# Patient Record
Sex: Male | Born: 1960
Health system: Southern US, Community
[De-identification: ages and names within clinical notes are randomized; demographics above are authoritative.]

## PROBLEM LIST (undated history)

## (undated) DIAGNOSIS — T7840XA Allergy, unspecified, initial encounter: Secondary | ICD-10-CM

## (undated) DIAGNOSIS — C8290 Follicular lymphoma, unspecified, unspecified site: Secondary | ICD-10-CM

## (undated) DIAGNOSIS — I10 Essential (primary) hypertension: Secondary | ICD-10-CM

## (undated) HISTORY — PX: TONSILLECTOMY AND ADENOIDECTOMY: SHX28

## (undated) HISTORY — DX: Allergy, unspecified, initial encounter: T78.40XA

---

## 2017-03-17 ENCOUNTER — Other Ambulatory Visit: Payer: Self-pay | Admitting: Oncology

## 2017-03-17 DIAGNOSIS — C8290 Follicular lymphoma, unspecified, unspecified site: Secondary | ICD-10-CM

## 2017-03-17 HISTORY — DX: Follicular lymphoma, unspecified, unspecified site: C82.90

## 2017-04-06 ENCOUNTER — Other Ambulatory Visit: Payer: Self-pay | Admitting: Oncology

## 2017-04-16 NOTE — Progress Notes (Signed)
[facility]  Telephone:([phone] [phone]  ID: Jonathon Jacobs OB: July 01, 1961  MR#: [id number]  UVO#:[id number]  Patient Care Team: Patient, No Pcp Per as PCP - General (General Practice)  CHIEF COMPLAINT: Follicular lymphoma, left superior forehead  INTERVAL HISTORY: Patient is a 56 year old male with no significant past medical history who noticed a slowly increasing mass on his left forehead over the past year. Subsequent biopsy and excision revealed a probable follicular lymphoma. He is anxious, but otherwise feels well. He has no neurologic complaints. He denies any fevers, chills, night sweats, or weight loss. He has no chest pain or shortness of breath. He denies any nausea, vomiting, constipation, or diarrhea. He has no urinary complaints. Patient otherwise feels well and offers no further specific complaints.   REVIEW OF SYSTEMS:   Review of Systems  Constitutional: Negative.  Negative for chills, diaphoresis, fever, malaise/fatigue and weight loss.  Respiratory: Negative.  Negative for cough and shortness of breath.   Cardiovascular: Negative.  Negative for chest pain and leg swelling.  Gastrointestinal: Negative.  Negative for abdominal pain.  Genitourinary: Negative.   Musculoskeletal: Negative.   Skin: Negative.  Negative for rash.  Neurological: Negative for sensory change and weakness.  Psychiatric/Behavioral: The patient is nervous/anxious.     As per HPI. Otherwise, a complete review of systems is negative.  PAST MEDICAL HISTORY: Past Medical History:  Diagnosis Date  . Allergy     PAST SURGICAL HISTORY: History reviewed. No pertinent surgical history.  FAMILY HISTORY: Family History  Problem Relation Age of Onset  . Cervical cancer Mother   . Heart disease Mother   . Prostate cancer Father   . Dementia Maternal Grandfather     ADVANCED DIRECTIVES (Y/N):  N  HEALTH MAINTENANCE: Social History  Substance Use Topics  .  Smoking status: Former Research scientist (life sciences)  . Smokeless tobacco: Never Used  . Alcohol use No     Colonoscopy:  PAP:  Bone density:  Lipid panel:  No Known Allergies  Current Outpatient Prescriptions  Medication Sig Dispense Refill  . Ascorbic Acid (VITAMIN C PO) Take by mouth.    Marland Kitchen b complex vitamins capsule Take 1 capsule by mouth daily.    . Grape Seed Extract 100 MG CAPS Take by mouth.    Marland Kitchen MILK THISTLE PO Take by mouth.    Marland Kitchen UNABLE TO FIND Med Name: silca     No current facility-administered medications for this visit.     OBJECTIVE: Vitals:   04/20/17 1208  BP: (!) 173/91  Pulse: (!) 53  Resp: 18  Temp: 98.2 F (36.8 C)     Body mass index is 27.29 kg/m.    ECOG FS:0 - Asymptomatic  General: Well-developed, well-nourished, no acute distress. Eyes: Pink conjunctiva, anicteric sclera. HEENT: Normocephalic, moist mucous membranes, clear oropharnyx. Well-healed surgical scar left forehead. Lungs: Clear to auscultation bilaterally. Heart: Regular rate and rhythm. No rubs, murmurs, or gallops. Abdomen: Soft, nontender, nondistended. No organomegaly noted, normoactive bowel sounds. Musculoskeletal: No edema, cyanosis, or clubbing. Neuro: Alert, answering all questions appropriately. Cranial nerves grossly intact. Skin: No rashes or petechiae noted. Psych: Normal affect. Lymphatics: No cervical, calvicular, axillary or inguinal LAD.   LAB RESULTS:  Lab Results  Component Value Date   NA 139 04/20/2017   K 4.1 04/20/2017   CL 104 04/20/2017   CO2 29 04/20/2017   GLUCOSE 98 04/20/2017   BUN 14 04/20/2017   CREATININE 1.15 04/20/2017   CALCIUM 9.1 04/20/2017  PROT 7.3 04/20/2017   ALBUMIN 4.3 04/20/2017   AST 25 04/20/2017   ALT 29 04/20/2017   ALKPHOS 66 04/20/2017   BILITOT 2.1 (H) 04/20/2017   GFRNONAA >60 04/20/2017   GFRAA >60 04/20/2017    Lab Results  Component Value Date   WBC 5.3 04/20/2017   NEUTROABS 2.8 04/20/2017   HGB 15.0 04/20/2017   HCT 42.5  04/20/2017   MCV 87.9 04/20/2017   PLT 209 04/20/2017     STUDIES: No results found.  ASSESSMENT: Follicular lymphoma, left superior forehead  PLAN:    1. Follicular lymphoma, left superior forehead: Pathology reviewed independently revealing probable follicular center lymphoma with B-cell gene rearrangement pending. Both peripheral and deep margins were positive. Plan on obtaining slides for further evaluation. We will get a PET scan in the next week to assess if there is any systemic disease or this is a confined cutaneous follicular lymphoma. If PET scan is positive, patient will also require a bone marrow biopsy. If lymphoma is isolated to his left superior forehead, he may benefit from XRT for local control of disease. If PET scan reveals more systemic disease, will further evaluate and determine whether observation or treatment is necessary. Return to clinic in 1 week to discuss his PET scan results and treatment planning if necessary.  Approximate 60 minutes was spent in discussion of which greater than 50% was consultation.  Patient expressed understanding and was in agreement with this plan. He also understands that He can call clinic at any time with any questions, concerns, or complaints.   Cancer Staging No matching staging information was found for the patient.  Lloyd Huger, MD   04/20/2017 1:37 PM

## 2017-04-20 ENCOUNTER — Inpatient Hospital Stay: Payer: BLUE CROSS/BLUE SHIELD | Attending: Oncology | Admitting: Oncology

## 2017-04-20 ENCOUNTER — Encounter (INDEPENDENT_AMBULATORY_CARE_PROVIDER_SITE_OTHER): Payer: Self-pay

## 2017-04-20 ENCOUNTER — Encounter: Payer: Self-pay | Admitting: Oncology

## 2017-04-20 ENCOUNTER — Inpatient Hospital Stay: Payer: BLUE CROSS/BLUE SHIELD

## 2017-04-20 VITALS — BP 173/91 | HR 53 | Temp 98.2°F | Resp 18 | Ht 72.0 in | Wt 201.2 lb

## 2017-04-20 DIAGNOSIS — Z79899 Other long term (current) drug therapy: Secondary | ICD-10-CM | POA: Insufficient documentation

## 2017-04-20 DIAGNOSIS — Z87891 Personal history of nicotine dependence: Secondary | ICD-10-CM | POA: Diagnosis not present

## 2017-04-20 DIAGNOSIS — C8291 Follicular lymphoma, unspecified, lymph nodes of head, face, and neck: Secondary | ICD-10-CM | POA: Diagnosis not present

## 2017-04-20 DIAGNOSIS — Z8049 Family history of malignant neoplasm of other genital organs: Secondary | ICD-10-CM | POA: Insufficient documentation

## 2017-04-20 DIAGNOSIS — F419 Anxiety disorder, unspecified: Secondary | ICD-10-CM | POA: Diagnosis not present

## 2017-04-20 DIAGNOSIS — Z8042 Family history of malignant neoplasm of prostate: Secondary | ICD-10-CM | POA: Insufficient documentation

## 2017-04-20 DIAGNOSIS — C8290 Follicular lymphoma, unspecified, unspecified site: Secondary | ICD-10-CM

## 2017-04-20 LAB — CBC WITH DIFFERENTIAL/PLATELET
BASOS PCT: 1 %
Basophils Absolute: 0 10*3/uL (ref 0–0.1)
Eosinophils Absolute: 0.1 10*3/uL (ref 0–0.7)
Eosinophils Relative: 2 %
HCT: 42.5 % (ref 40.0–52.0)
HEMOGLOBIN: 15 g/dL (ref 13.0–18.0)
Lymphocytes Relative: 37 %
Lymphs Abs: 1.9 10*3/uL (ref 1.0–3.6)
MCH: 31 pg (ref 26.0–34.0)
MCHC: 35.3 g/dL (ref 32.0–36.0)
MCV: 87.9 fL (ref 80.0–100.0)
MONOS PCT: 7 %
Monocytes Absolute: 0.4 10*3/uL (ref 0.2–1.0)
NEUTROS PCT: 53 %
Neutro Abs: 2.8 10*3/uL (ref 1.4–6.5)
PLATELETS: 209 10*3/uL (ref 150–440)
RBC: 4.83 MIL/uL (ref 4.40–5.90)
RDW: 12.8 % (ref 11.5–14.5)
WBC: 5.3 10*3/uL (ref 3.8–10.6)

## 2017-04-20 LAB — COMPREHENSIVE METABOLIC PANEL
ALK PHOS: 66 U/L (ref 38–126)
ALT: 29 U/L (ref 17–63)
ANION GAP: 6 (ref 5–15)
AST: 25 U/L (ref 15–41)
Albumin: 4.3 g/dL (ref 3.5–5.0)
BUN: 14 mg/dL (ref 6–20)
CALCIUM: 9.1 mg/dL (ref 8.9–10.3)
CHLORIDE: 104 mmol/L (ref 101–111)
CO2: 29 mmol/L (ref 22–32)
Creatinine, Ser: 1.15 mg/dL (ref 0.61–1.24)
GFR calc non Af Amer: >60 mL/min (ref >60)
GLUCOSE: 98 mg/dL (ref 65–99)
POTASSIUM: 4.1 mmol/L (ref 3.5–5.1)
SODIUM: 139 mmol/L (ref 135–145)
Total Bilirubin: 2.1 mg/dL — ABNORMAL HIGH (ref 0.3–1.2)
Total Protein: 7.3 g/dL (ref 6.5–8.1)

## 2017-04-20 LAB — LACTATE DEHYDROGENASE: LDH: 130 U/L (ref 98–192)

## 2017-04-20 NOTE — Progress Notes (Signed)
Patient is here for follow up  

## 2017-04-27 ENCOUNTER — Inpatient Hospital Stay: Payer: BLUE CROSS/BLUE SHIELD | Admitting: Oncology

## 2017-04-28 ENCOUNTER — Encounter
Admission: RE | Admit: 2017-04-28 | Discharge: 2017-04-28 | Disposition: A | Discharge status: Home / Self Care | Payer: BLUE CROSS/BLUE SHIELD | Source: Ambulatory Visit | Attending: Oncology | Admitting: Oncology

## 2017-04-28 ENCOUNTER — Other Ambulatory Visit: Payer: Self-pay | Admitting: *Deleted

## 2017-04-28 DIAGNOSIS — C8290 Follicular lymphoma, unspecified, unspecified site: Secondary | ICD-10-CM | POA: Insufficient documentation

## 2017-04-28 LAB — GLUCOSE, CAPILLARY: GLUCOSE-CAPILLARY: 89 mg/dL (ref 65–99)

## 2017-04-28 MED ORDER — FLUDEOXYGLUCOSE F - 18 (FDG) INJECTION
12.1000 | Freq: Once | INTRAVENOUS | Status: AC | PRN
  Administered 2017-04-28: 12.1 via INTRAVENOUS

## 2017-04-28 NOTE — Progress Notes (Signed)
[facility]  [phone] [phone]  ID: Jonathon Jacobs OB: 08/22/1960  MR#: [id number]  CSN#:[id number]  Patient Care Team: Patient, No Pcp Per as PCP - General (General Practice)  CHIEF COMPLAINT: Follicular lymphoma, left superior forehead  INTERVAL HISTORY: Patient returns to clinic today for further evaluation and discussion of his PET scan results. He continues to be anxious, but otherwise feels well. He has no neurologic complaints. He denies any fevers, chills, night sweats, or weight loss. He has no chest pain or shortness of breath. He denies any nausea, vomiting, constipation, or diarrhea. He has no urinary complaints. Patient otherwise feels well and offers no further specific complaints.   REVIEW OF SYSTEMS:   Review of Systems  Constitutional: Negative.  Negative for chills, diaphoresis, fever, malaise/fatigue and weight loss.  Respiratory: Negative.  Negative for cough and shortness of breath.   Cardiovascular: Negative.  Negative for chest pain and leg swelling.  Gastrointestinal: Negative.  Negative for abdominal pain.  Genitourinary: Negative.   Musculoskeletal: Negative.   Skin: Negative.  Negative for rash.  Neurological: Negative for sensory change and weakness.  Psychiatric/Behavioral: The patient is nervous/anxious.     As per HPI. Otherwise, a complete review of systems is negative.  PAST MEDICAL HISTORY: Past Medical History:  Diagnosis Date  . Allergy     PAST SURGICAL HISTORY: No past surgical history on file.  FAMILY HISTORY: Family History  Problem Relation Age of Onset  . Cervical cancer Mother   . Heart disease Mother   . Prostate cancer Father   . Dementia Maternal Grandfather     ADVANCED DIRECTIVES (Y/N):  N  HEALTH MAINTENANCE: Social History  Substance Use Topics  . Smoking status: Former Smoker  . Smokeless tobacco: Never Used  . Alcohol use No     Colonoscopy:  PAP:  Bone  density:  Lipid panel:  No Known Allergies  Current Outpatient Prescriptions  Medication Sig Dispense Refill  . Ascorbic Acid (VITAMIN C PO) Take by mouth.    . b complex vitamins capsule Take 1 capsule by mouth daily.    . Grape Seed Extract 100 MG CAPS Take by mouth.    . MILK THISTLE PO Take by mouth.    . UNABLE TO FIND Med Name: silca     No current facility-administered medications for this visit.     OBJECTIVE: Vitals:   04/29/17 1444  BP: (!) 168/95  Pulse: (!) 58  Resp: 18  Temp: 97.9 F (36.6 C)     Body mass index is 27.61 kg/m.    ECOG FS:0 - Asymptomatic  General: Well-developed, well-nourished, no acute distress. Eyes: Pink conjunctiva, anicteric sclera. HEENT: Normocephalic, moist mucous membranes, clear oropharnyx. Well-healed surgical scar left forehead. Lungs: Clear to auscultation bilaterally. Heart: Regular rate and rhythm. No rubs, murmurs, or gallops. Abdomen: Soft, nontender, nondistended. No organomegaly noted, normoactive bowel sounds. Musculoskeletal: No edema, cyanosis, or clubbing. Neuro: Alert, answering all questions appropriately. Cranial nerves grossly intact. Skin: No rashes or petechiae noted. Psych: Normal affect. Lymphatics: No cervical, calvicular, axillary or inguinal LAD.   LAB RESULTS:  Lab Results  Component Value Date   NA 139 04/20/2017   K 4.1 04/20/2017   CL 104 04/20/2017   CO2 29 04/20/2017   GLUCOSE 98 04/20/2017   BUN 14 04/20/2017   CREATININE 1.15 04/20/2017   CALCIUM 9.1 04/20/2017   PROT 7.3 04/20/2017   ALBUMIN 4.3 04/20/2017   AST 25 04/20/2017   ALT   29 04/20/2017   ALKPHOS 66 04/20/2017   BILITOT 2.1 (H) 04/20/2017   GFRNONAA >60 04/20/2017   GFRAA >60 04/20/2017    Lab Results  Component Value Date   WBC 5.3 04/20/2017   NEUTROABS 2.8 04/20/2017   HGB 15.0 04/20/2017   HCT 42.5 04/20/2017   MCV 87.9 04/20/2017   PLT 209 04/20/2017     STUDIES: Nm Pet Image Initial (pi) Skull Base To  Thigh  Result Date: 04/28/2017 CLINICAL DATA:  Initial Treatment strategy for follicular lymphoma. EXAM: NUCLEAR MEDICINE PET SKULL BASE TO THIGH TECHNIQUE: 12.1 mCi F-18 FDG was injected intravenously. Full-ring PET imaging was performed from the skull vertex to thigh after the radiotracer. CT data was obtained and used for attenuation correction and anatomic localization. FASTING BLOOD GLUCOSE:  Value: 89 mg/dl COMPARISON:  None. FINDINGS: NECK/HEAD Very subtle cutaneous activity noted along the right forehead, but with maximum SUV of only about 2.2. No other significant scalp activity identified. No hypermetabolic adenopathy in the neck. Small mucous retention cyst in the left maxillary sinus. CHEST No hypermetabolic mediastinal or hilar nodes. No suspicious pulmonary nodules on the CT data. ABDOMEN/PELVIS No abnormal hypermetabolic activity within the liver, pancreas, adrenal glands, or spleen. No hypermetabolic lymph nodes in the abdomen or pelvis. No splenomegaly. SKELETON No focal hypermetabolic activity to suggest skeletal metastasis. Bridging spurring of the left sacroiliac joint with minimal spurring of the right sacroiliac joint. IMPRESSION: 1. No findings of active lymphoma in the neck, chest, abdomen/pelvis, or visualized skeleton. There is some very subtle cutaneous activity asymmetry along the right forehead, maximum SUV 2.2, potentially correlating with the biopsied region. Electronically Signed   By: Van Clines M.D.   On: 04/28/2017 16:14    ASSESSMENT: Follicular lymphoma, left superior forehead. Stage IE.  PLAN:    1. Follicular lymphoma, left superior forehead: Pathology reviewed independently revealing probable follicular center lymphoma with B-cell gene rearrangement pending. Both peripheral and deep margins were positive. Plan on obtaining slides for further evaluation. PET scan results reviewed independently and reported as above with no evidence of systemic disease. Because  of this, patient does not require bone marrow biopsy at this time. A referral was given to radiation oncology for consideration of local control of disease. He does not require systemic therapy at this time. Return to clinic in 3 months for routine evaluation. We will likely repeat imaging in 6 months.  Approximately 30 minutes was spent in discussion of which greater than 50% was consultation.  Patient expressed understanding and was in agreement with this plan. He also understands that He can call clinic at any time with any questions, concerns, or complaints.   Cancer Staging Follicular lymphoma ([place]) Staging form: Hodgkin and Non-Hodgkin Lymphoma, AJCC 8th Edition - Clinical stage from 9/48/0165: Stage IE (Follicular lymphoma) - Signed by Lloyd Huger, MD on 04/30/2017   Lloyd Huger, MD   04/30/2017 3:44 PM

## 2017-04-29 ENCOUNTER — Inpatient Hospital Stay (HOSPITAL_BASED_OUTPATIENT_CLINIC_OR_DEPARTMENT_OTHER): Payer: BLUE CROSS/BLUE SHIELD | Admitting: Oncology

## 2017-04-29 DIAGNOSIS — Z79899 Other long term (current) drug therapy: Secondary | ICD-10-CM

## 2017-04-29 DIAGNOSIS — C8291 Follicular lymphoma, unspecified, lymph nodes of head, face, and neck: Secondary | ICD-10-CM | POA: Diagnosis not present

## 2017-04-29 DIAGNOSIS — Z8042 Family history of malignant neoplasm of prostate: Secondary | ICD-10-CM

## 2017-04-29 DIAGNOSIS — Z87891 Personal history of nicotine dependence: Secondary | ICD-10-CM | POA: Diagnosis not present

## 2017-04-29 DIAGNOSIS — F419 Anxiety disorder, unspecified: Secondary | ICD-10-CM

## 2017-04-29 DIAGNOSIS — C8290 Follicular lymphoma, unspecified, unspecified site: Secondary | ICD-10-CM | POA: Diagnosis not present

## 2017-04-29 DIAGNOSIS — Z8049 Family history of malignant neoplasm of other genital organs: Secondary | ICD-10-CM | POA: Diagnosis not present

## 2017-05-03 ENCOUNTER — Encounter: Payer: Self-pay | Admitting: Radiation Oncology

## 2017-05-03 ENCOUNTER — Ambulatory Visit
Admission: RE | Admit: 2017-05-03 | Discharge: 2017-05-03 | Disposition: A | Discharge status: Home / Self Care | Payer: BLUE CROSS/BLUE SHIELD | Source: Ambulatory Visit | Attending: Radiation Oncology | Admitting: Radiation Oncology

## 2017-05-03 VITALS — BP 152/88 | HR 52 | Temp 97.6°F | Resp 18 | Ht 72.0 in | Wt 202.3 lb

## 2017-05-03 DIAGNOSIS — Z510 Encounter for antineoplastic radiation therapy: Secondary | ICD-10-CM | POA: Insufficient documentation

## 2017-05-03 DIAGNOSIS — C8201 Follicular lymphoma grade I, lymph nodes of head, face, and neck: Secondary | ICD-10-CM | POA: Insufficient documentation

## 2017-05-03 DIAGNOSIS — Z8041 Family history of malignant neoplasm of ovary: Secondary | ICD-10-CM | POA: Insufficient documentation

## 2017-05-03 DIAGNOSIS — Z87891 Personal history of nicotine dependence: Secondary | ICD-10-CM | POA: Insufficient documentation

## 2017-05-03 DIAGNOSIS — Z8042 Family history of malignant neoplasm of prostate: Secondary | ICD-10-CM | POA: Insufficient documentation

## 2017-05-03 DIAGNOSIS — Z79899 Other long term (current) drug therapy: Secondary | ICD-10-CM | POA: Insufficient documentation

## 2017-05-03 DIAGNOSIS — C8290 Follicular lymphoma, unspecified, unspecified site: Secondary | ICD-10-CM

## 2017-05-03 NOTE — Consult Note (Signed)
NEW PATIENT EVALUATION  Name: Jonathon Jacobs  MRN: [id number]  Date:   05/03/2017     DOB: [birth year]   This 56 y.o. male patient presents to the clinic for initial evaluation of stage I E follicular B-cell lymphoma of the right for head.  REFERRING PHYSICIAN: No ref. provider found  CHIEF COMPLAINT:  Chief Complaint  Patient presents with  . Cancer    Pt is here for initial consultation of lymphoma    DIAGNOSIS: The encounter diagnosis was Follicular lymphoma, unspecified grade, unspecified body region [facility]).   PREVIOUS INVESTIGATIONS:  PET CT scan reviewed Pathology reports reviewed Clinical notes reviewed  HPI: Patient is a 56 year old male who presented with a slowly growing mass on his left forehead. He underwent biopsy which was positive for lytic follicular lymphoma B cell type. Patient then underwent a PET CT scan showing no evidence of peripheral adenopathy. There was some minor hypermetabolic activity in the area of the lesion on the right for head. On review of his pathology both peripheral and deep margins were positive. He has been seen by medical oncology based on the one E nature of his lesion has recommended curative radiation therapy. He is seen today for evaluation. He is doing well he specifically denies fever chills night sweats. He also specifically denies any other areas of adenopathy.  PLANNED TREATMENT REGIMEN: Electron beam therapy  PAST MEDICAL HISTORY:  has a past medical history of Allergy.    PAST SURGICAL HISTORY: History reviewed. No pertinent surgical history.  FAMILY HISTORY: family history includes Cervical cancer in his mother; Dementia in his maternal grandfather; Heart disease in his mother; Prostate cancer in his father.  SOCIAL HISTORY:  reports that he has quit smoking. He has never used smokeless tobacco. He reports that he does not drink alcohol or use drugs.  ALLERGIES: Patient has no known allergies.  MEDICATIONS:  Current Outpatient  Prescriptions  Medication Sig Dispense Refill  . Ascorbic Acid (VITAMIN C PO) Take by mouth.    Marland Kitchen b complex vitamins capsule Take 1 capsule by mouth daily.    . Grape Seed Extract 100 MG CAPS Take by mouth.    Marland Kitchen MILK THISTLE PO Take by mouth.    Marland Kitchen UNABLE TO FIND Med Name: silca     No current facility-administered medications for this encounter.     ECOG PERFORMANCE STATUS:  0 - Asymptomatic  REVIEW OF SYSTEMS:  Patient denies any weight loss, fatigue, weakness, fever, chills or night sweats. Patient denies any loss of vision, blurred vision. Patient denies any ringing  of the ears or hearing loss. No irregular heartbeat. Patient denies heart murmur or history of fainting. Patient denies any chest pain or pain radiating to her upper extremities. Patient denies any shortness of breath, difficulty breathing at night, cough or hemoptysis. Patient denies any swelling in the lower legs. Patient denies any nausea vomiting, vomiting of blood, or coffee ground material in the vomitus. Patient denies any stomach pain. Patient states has had normal bowel movements no significant constipation or diarrhea. Patient denies any dysuria, hematuria or significant nocturia. Patient denies any problems walking, swelling in the joints or loss of balance. Patient denies any skin changes, loss of hair or loss of weight. Patient denies any excessive worrying or anxiety or significant depression. Patient denies any problems with insomnia. Patient denies excessive thirst, polyuria, polydipsia. Patient denies any swollen glands, patient denies easy bruising or easy bleeding. Patient denies any recent infections, allergies or URI. Patient "  s visual fields have not changed significantly in recent time.    PHYSICAL EXAM: BP (!) 152/88   Pulse (!) 52   Temp 97.6 F (36.4 C)   Resp 18   Ht 6' (1.829 m)   Wt 202 lb 4.4 oz (91.7 kg)   BMI 27.43 kg/m  He has a small erythematous area of the right scalp for head. No real  nodular component is noted. No evidence of preauricular sub-digastric cervical or supraclavicular adenopathy is identified. He has no other adenopathy. Well-developed well-nourished patient in NAD. HEENT reveals PERLA, EOMI, discs not visualized.  Oral cavity is clear. No oral mucosal lesions are identified. Neck is clear without evidence of cervical or supraclavicular adenopathy. Lungs are clear to A&P. Cardiac examination is essentially unremarkable with regular rate and rhythm without murmur rub or thrill. Abdomen is benign with no organomegaly or masses noted. Motor sensory and DTR levels are equal and symmetric in the upper and lower extremities. Cranial nerves II through XII are grossly intact. Proprioception is intact. No peripheral adenopathy or edema is identified. No motor or sensory levels are noted. Crude visual fields are within normal range.  LABORATORY DATA: Pathology reports are reviewed    RADIOLOGY RESULTS: PET CT scan is reviewed   IMPRESSION: Stage I E follicular B-cell lymphoma of the right for head in 56 year old male  PLAN: At this time I to go ahead with it course of electron beam therapy. I'll incorporate the lesion with a 0.5-1 cm margin. I will plan on delivering 3000 cGy in 15 fractions. Risks and benefits of treatment including hyperpigmentation of the skin skin reaction fatigue slight alteration of blood counts all were discussed in detail. I see no evidence of neck adenopathy on his PET/CT scan so will not treat his peripheral lymphatics including his sub-digastric cervical or supraclavicular nodes. Reviewed risks and benefits were reviewed with the patient and his wife both seem to comprehend my treatment plan well. I first a separate ordered CT simulation in about 2 weeks since they are planning a vacation.  I would like to take this opportunity to thank you for allowing me to participate in the care of your patient.Armstead Peaks., MD

## 2017-05-07 LAB — SLIDE CONSULT, PATHOLOGY ARMC

## 2017-05-10 LAB — PATHOLOGY

## 2017-05-18 LAB — PATHOLOGY

## 2017-05-20 ENCOUNTER — Ambulatory Visit
Admission: RE | Admit: 2017-05-20 | Discharge: 2017-05-20 | Disposition: A | Discharge status: Home / Self Care | Payer: BLUE CROSS/BLUE SHIELD | Source: Ambulatory Visit | Attending: Radiation Oncology | Admitting: Radiation Oncology

## 2017-05-20 DIAGNOSIS — C8201 Follicular lymphoma grade I, lymph nodes of head, face, and neck: Secondary | ICD-10-CM | POA: Diagnosis not present

## 2017-05-25 DIAGNOSIS — C8201 Follicular lymphoma grade I, lymph nodes of head, face, and neck: Secondary | ICD-10-CM | POA: Diagnosis not present

## 2017-06-03 ENCOUNTER — Ambulatory Visit
Admission: RE | Admit: 2017-06-03 | Discharge: 2017-06-03 | Disposition: A | Discharge status: Home / Self Care | Payer: BLUE CROSS/BLUE SHIELD | Source: Ambulatory Visit | Attending: Radiation Oncology | Admitting: Radiation Oncology

## 2017-06-03 DIAGNOSIS — C8201 Follicular lymphoma grade I, lymph nodes of head, face, and neck: Secondary | ICD-10-CM | POA: Diagnosis not present

## 2017-06-04 ENCOUNTER — Ambulatory Visit
Admission: RE | Admit: 2017-06-04 | Discharge: 2017-06-04 | Disposition: A | Discharge status: Home / Self Care | Payer: BLUE CROSS/BLUE SHIELD | Source: Ambulatory Visit | Attending: Radiation Oncology | Admitting: Radiation Oncology

## 2017-06-04 DIAGNOSIS — C8201 Follicular lymphoma grade I, lymph nodes of head, face, and neck: Secondary | ICD-10-CM | POA: Diagnosis not present

## 2017-06-07 ENCOUNTER — Ambulatory Visit
Admission: RE | Admit: 2017-06-07 | Discharge: 2017-06-07 | Disposition: A | Discharge status: Home / Self Care | Payer: BLUE CROSS/BLUE SHIELD | Source: Ambulatory Visit | Attending: Radiation Oncology | Admitting: Radiation Oncology

## 2017-06-07 DIAGNOSIS — C8201 Follicular lymphoma grade I, lymph nodes of head, face, and neck: Secondary | ICD-10-CM | POA: Diagnosis not present

## 2017-06-08 ENCOUNTER — Ambulatory Visit
Admission: RE | Admit: 2017-06-08 | Discharge: 2017-06-08 | Disposition: A | Discharge status: Home / Self Care | Payer: BLUE CROSS/BLUE SHIELD | Source: Ambulatory Visit | Attending: Radiation Oncology | Admitting: Radiation Oncology

## 2017-06-08 DIAGNOSIS — C8201 Follicular lymphoma grade I, lymph nodes of head, face, and neck: Secondary | ICD-10-CM | POA: Diagnosis not present

## 2017-06-09 ENCOUNTER — Ambulatory Visit
Admission: RE | Admit: 2017-06-09 | Discharge: 2017-06-09 | Disposition: A | Discharge status: Home / Self Care | Payer: BLUE CROSS/BLUE SHIELD | Source: Ambulatory Visit | Attending: Radiation Oncology | Admitting: Radiation Oncology

## 2017-06-09 DIAGNOSIS — C8201 Follicular lymphoma grade I, lymph nodes of head, face, and neck: Secondary | ICD-10-CM | POA: Diagnosis not present

## 2017-06-10 ENCOUNTER — Ambulatory Visit
Admission: RE | Admit: 2017-06-10 | Discharge: 2017-06-10 | Disposition: A | Discharge status: Home / Self Care | Payer: BLUE CROSS/BLUE SHIELD | Source: Ambulatory Visit | Attending: Radiation Oncology | Admitting: Radiation Oncology

## 2017-06-10 DIAGNOSIS — C8201 Follicular lymphoma grade I, lymph nodes of head, face, and neck: Secondary | ICD-10-CM | POA: Diagnosis not present

## 2017-06-11 ENCOUNTER — Ambulatory Visit
Admission: RE | Admit: 2017-06-11 | Discharge: 2017-06-11 | Disposition: A | Discharge status: Home / Self Care | Payer: BLUE CROSS/BLUE SHIELD | Source: Ambulatory Visit | Attending: Radiation Oncology | Admitting: Radiation Oncology

## 2017-06-11 DIAGNOSIS — C8201 Follicular lymphoma grade I, lymph nodes of head, face, and neck: Secondary | ICD-10-CM | POA: Diagnosis not present

## 2017-06-14 ENCOUNTER — Ambulatory Visit
Admission: RE | Admit: 2017-06-14 | Discharge: 2017-06-14 | Disposition: A | Discharge status: Home / Self Care | Payer: BLUE CROSS/BLUE SHIELD | Source: Ambulatory Visit | Attending: Radiation Oncology | Admitting: Radiation Oncology

## 2017-06-14 DIAGNOSIS — C8201 Follicular lymphoma grade I, lymph nodes of head, face, and neck: Secondary | ICD-10-CM | POA: Diagnosis not present

## 2017-06-15 ENCOUNTER — Ambulatory Visit
Admission: RE | Admit: 2017-06-15 | Discharge: 2017-06-15 | Disposition: A | Discharge status: Home / Self Care | Payer: BLUE CROSS/BLUE SHIELD | Source: Ambulatory Visit | Attending: Radiation Oncology | Admitting: Radiation Oncology

## 2017-06-15 DIAGNOSIS — C8201 Follicular lymphoma grade I, lymph nodes of head, face, and neck: Secondary | ICD-10-CM | POA: Diagnosis not present

## 2017-06-16 ENCOUNTER — Ambulatory Visit
Admission: RE | Admit: 2017-06-16 | Discharge: 2017-06-16 | Disposition: A | Discharge status: Home / Self Care | Payer: BLUE CROSS/BLUE SHIELD | Source: Ambulatory Visit | Attending: Radiation Oncology | Admitting: Radiation Oncology

## 2017-06-16 DIAGNOSIS — C8201 Follicular lymphoma grade I, lymph nodes of head, face, and neck: Secondary | ICD-10-CM | POA: Diagnosis not present

## 2017-06-17 ENCOUNTER — Ambulatory Visit
Admission: RE | Admit: 2017-06-17 | Discharge: 2017-06-17 | Disposition: A | Discharge status: Home / Self Care | Payer: BLUE CROSS/BLUE SHIELD | Source: Ambulatory Visit | Attending: Radiation Oncology | Admitting: Radiation Oncology

## 2017-06-17 DIAGNOSIS — C8201 Follicular lymphoma grade I, lymph nodes of head, face, and neck: Secondary | ICD-10-CM | POA: Diagnosis not present

## 2017-06-18 ENCOUNTER — Ambulatory Visit
Admission: RE | Admit: 2017-06-18 | Discharge: 2017-06-18 | Disposition: A | Discharge status: Home / Self Care | Payer: BLUE CROSS/BLUE SHIELD | Source: Ambulatory Visit | Attending: Radiation Oncology | Admitting: Radiation Oncology

## 2017-06-18 DIAGNOSIS — C8201 Follicular lymphoma grade I, lymph nodes of head, face, and neck: Secondary | ICD-10-CM | POA: Diagnosis not present

## 2017-06-21 ENCOUNTER — Ambulatory Visit
Admission: RE | Admit: 2017-06-21 | Discharge: 2017-06-21 | Disposition: A | Discharge status: Home / Self Care | Payer: BLUE CROSS/BLUE SHIELD | Source: Ambulatory Visit | Attending: Radiation Oncology | Admitting: Radiation Oncology

## 2017-06-21 DIAGNOSIS — C8201 Follicular lymphoma grade I, lymph nodes of head, face, and neck: Secondary | ICD-10-CM | POA: Diagnosis not present

## 2017-06-22 ENCOUNTER — Ambulatory Visit
Admission: RE | Admit: 2017-06-22 | Discharge: 2017-06-22 | Disposition: A | Discharge status: Home / Self Care | Payer: BLUE CROSS/BLUE SHIELD | Source: Ambulatory Visit | Attending: Radiation Oncology | Admitting: Radiation Oncology

## 2017-06-22 DIAGNOSIS — C8201 Follicular lymphoma grade I, lymph nodes of head, face, and neck: Secondary | ICD-10-CM | POA: Diagnosis not present

## 2017-06-23 ENCOUNTER — Ambulatory Visit
Admission: RE | Admit: 2017-06-23 | Discharge: 2017-06-23 | Disposition: A | Discharge status: Home / Self Care | Payer: BLUE CROSS/BLUE SHIELD | Source: Ambulatory Visit | Attending: Radiation Oncology | Admitting: Radiation Oncology

## 2017-06-23 DIAGNOSIS — C8201 Follicular lymphoma grade I, lymph nodes of head, face, and neck: Secondary | ICD-10-CM | POA: Diagnosis not present

## 2017-07-02 ENCOUNTER — Ambulatory Visit: Payer: BLUE CROSS/BLUE SHIELD | Admitting: Radiation Oncology

## 2017-07-28 NOTE — Progress Notes (Signed)
[place]  Telephone:([phone] [phone]  ID: Jonathon Jacobs OB: Aug 06, 1961  MR#: [id number]  YDX#:[id number]  Patient Care Team: Patient, No Pcp Per as PCP - General (General Practice)  CHIEF COMPLAINT: Follicular lymphoma, left superior forehead  INTERVAL HISTORY: Patient returns to clinic today for further evaluation and discussion of his PET scan results. He continues to be anxious, but otherwise feels well. He has no neurologic complaints. He denies any fevers, chills, night sweats, or weight loss. He has no chest pain or shortness of breath. He denies any nausea, vomiting, constipation, or diarrhea. He has no urinary complaints. Patient offers no specific complaints today.   REVIEW OF SYSTEMS:   Review of Systems  Constitutional: Negative.  Negative for chills, diaphoresis, fever, malaise/fatigue and weight loss.  Respiratory: Negative.  Negative for cough and shortness of breath.   Cardiovascular: Negative.  Negative for chest pain and leg swelling.  Gastrointestinal: Negative.  Negative for abdominal pain.  Genitourinary: Negative.   Musculoskeletal: Negative.   Skin: Negative.  Negative for rash.  Neurological: Negative for sensory change and weakness.  Psychiatric/Behavioral: The patient is nervous/anxious.     As per HPI. Otherwise, a complete review of systems is negative.  PAST MEDICAL HISTORY: Past Medical History:  Diagnosis Date  . Allergy     PAST SURGICAL HISTORY: No past surgical history on file.  FAMILY HISTORY: Family History  Problem Relation Age of Onset  . Cervical cancer Mother   . Heart disease Mother   . Prostate cancer Father   . Dementia Maternal Grandfather     ADVANCED DIRECTIVES (Y/N):  N  HEALTH MAINTENANCE: Social History   Tobacco Use  . Smoking status: Former Research scientist (life sciences)  . Smokeless tobacco: Never Used  Substance Use Topics  . Alcohol use: No  . Drug use: No     Colonoscopy:  PAP:  Bone  density:  Lipid panel:  No Known Allergies  Current Outpatient Medications  Medication Sig Dispense Refill  . Ascorbic Acid (VITAMIN C PO) Take by mouth.    Marland Kitchen b complex vitamins capsule Take 1 capsule by mouth daily.    . Grape Seed Extract 100 MG CAPS Take by mouth.    Marland Kitchen MILK THISTLE PO Take by mouth.    Marland Kitchen UNABLE TO FIND Med Name: silca     No current facility-administered medications for this visit.     OBJECTIVE: Vitals:   07/29/17 1014  BP: (!) 163/92  Resp: 20  Temp: (!) 96.7 F (35.9 C)     Body mass index is 28.21 kg/m.    ECOG FS:0 - Asymptomatic  General: Well-developed, well-nourished, no acute distress. Eyes: Pink conjunctiva, anicteric sclera. HEENT: Normocephalic, moist mucous membranes, clear oropharnyx. Well-healed surgical scar left forehead. Lungs: Clear to auscultation bilaterally. Heart: Regular rate and rhythm. No rubs, murmurs, or gallops. Abdomen: Soft, nontender, nondistended. No organomegaly noted, normoactive bowel sounds. Musculoskeletal: No edema, cyanosis, or clubbing. Neuro: Alert, answering all questions appropriately. Cranial nerves grossly intact. Skin: No rashes or petechiae noted. Psych: Normal affect. Lymphatics: No cervical, calvicular, axillary or inguinal LAD.   LAB RESULTS:  Lab Results  Component Value Date   NA 139 04/20/2017   K 4.1 04/20/2017   CL 104 04/20/2017   CO2 29 04/20/2017   GLUCOSE 98 04/20/2017   BUN 14 04/20/2017   CREATININE 1.15 04/20/2017   CALCIUM 9.1 04/20/2017   PROT 7.3 04/20/2017   ALBUMIN 4.3 04/20/2017   AST 25 04/20/2017  ALT 29 04/20/2017   ALKPHOS 66 04/20/2017   BILITOT 2.1 (H) 04/20/2017   GFRNONAA >60 04/20/2017   GFRAA >60 04/20/2017    Lab Results  Component Value Date   WBC 5.3 04/20/2017   NEUTROABS 2.8 04/20/2017   HGB 15.0 04/20/2017   HCT 42.5 04/20/2017   MCV 87.9 04/20/2017   PLT 209 04/20/2017     STUDIES: No results found.  ASSESSMENT: Follicular lymphoma, left  superior forehead. Stage IE.  PLAN:    1. Follicular lymphoma, left superior forehead: Pathology reviewed independently revealing follicular center lymphoma with B-cell gene rearrangement. Both peripheral and deep margins were positive. Patient completed adjuvant XRT in November 2018. PET scan results from April 28, 2017 reviewed independently with no evidence of systemic disease. Because of this, patient did not require bone marrow biopsy or systemic treatment. Return to clinic in 3 months for routine evaluation and repeat imaging with PET.   Approximately 20 minutes was spent in discussion of which greater than 50% was consultation.  Patient expressed understanding and was in agreement with this plan. He also understands that He can call clinic at any time with any questions, concerns, or complaints.   Cancer Staging Follicular lymphoma ([place]) Staging form: Hodgkin and Non-Hodgkin Lymphoma, AJCC 8th Edition - Clinical stage from 9/47/0962: Stage IE (Follicular lymphoma) - Signed by Lloyd Huger, MD on 04/30/2017   Lloyd Huger, MD   08/01/2017 9:31 AM

## 2017-07-29 ENCOUNTER — Inpatient Hospital Stay: Payer: BLUE CROSS/BLUE SHIELD | Attending: Oncology | Admitting: Oncology

## 2017-07-29 ENCOUNTER — Encounter: Payer: Self-pay | Admitting: Radiation Oncology

## 2017-07-29 ENCOUNTER — Ambulatory Visit
Admission: RE | Admit: 2017-07-29 | Discharge: 2017-07-29 | Disposition: A | Discharge status: Home / Self Care | Payer: BLUE CROSS/BLUE SHIELD | Source: Ambulatory Visit | Attending: Radiation Oncology | Admitting: Radiation Oncology

## 2017-07-29 ENCOUNTER — Other Ambulatory Visit: Payer: Self-pay

## 2017-07-29 ENCOUNTER — Encounter (INDEPENDENT_AMBULATORY_CARE_PROVIDER_SITE_OTHER): Payer: Self-pay

## 2017-07-29 VITALS — BP 163/92 | Temp 96.7°F | Resp 20 | Wt 208.0 lb

## 2017-07-29 VITALS — BP 163/92 | HR 53 | Temp 96.7°F | Resp 20 | Wt 208.6 lb

## 2017-07-29 DIAGNOSIS — Z87891 Personal history of nicotine dependence: Secondary | ICD-10-CM | POA: Insufficient documentation

## 2017-07-29 DIAGNOSIS — C8201 Follicular lymphoma grade I, lymph nodes of head, face, and neck: Secondary | ICD-10-CM | POA: Insufficient documentation

## 2017-07-29 DIAGNOSIS — C8291 Follicular lymphoma, unspecified, lymph nodes of head, face, and neck: Secondary | ICD-10-CM | POA: Diagnosis not present

## 2017-07-29 DIAGNOSIS — Z923 Personal history of irradiation: Secondary | ICD-10-CM | POA: Insufficient documentation

## 2017-07-29 DIAGNOSIS — C8290 Follicular lymphoma, unspecified, unspecified site: Secondary | ICD-10-CM

## 2017-07-29 NOTE — Progress Notes (Signed)
Radiation Oncology Follow up Note  Name: Jonathon Jacobs   Date:   07/29/2017 MRN:  [id number] DOB: 11/11/1960    This 56 y.o. male presents to the clinic today for one-month follow-up status post electron beam radiation therapy to patient's scalp for stage IE follicular B-cell lymphoma.  REFERRING PROVIDER: No ref. provider found  HPI: Patient is a 56 year old male now one month out having electron beam radiation therapy to his right anterior scalp for stage I E follicular B-cell lymphoma. Seen today in routine follow up he is doing well. Area of erythema the scalp and was completely resited. He has no evidence of mass or nodularity the skin in the previous area. He specifically denies any new adenopathy fever chills or night sweats..  COMPLICATIONS OF TREATMENT: none  FOLLOW UP COMPLIANCE: keeps appointments   PHYSICAL EXAM:  BP (!) 163/92   Pulse (!) 53   Temp (!) 96.7 F (35.9 C)   Resp 20   Wt 208 lb 8.9 oz (94.6 kg)   BMI 28.29 kg/m  Area of the scalp shows no evidence of induration or erythema. No evidence of head and neck adenopathy is detected. No other peripheral adenopathy is noted. Well-developed well-nourished patient in NAD. HEENT reveals PERLA, EOMI, discs not visualized.  Oral cavity is clear. No oral mucosal lesions are identified. Neck is clear without evidence of cervical or supraclavicular adenopathy. Lungs are clear to A&P. Cardiac examination is essentially unremarkable with regular rate and rhythm without murmur rub or thrill. Abdomen is benign with no organomegaly or masses noted. Motor sensory and DTR levels are equal and symmetric in the upper and lower extremities. Cranial nerves II through XII are grossly intact. Proprioception is intact. No peripheral adenopathy or edema is identified. No motor or sensory levels are noted. Crude visual fields are within normal range.  RADIOLOGY RESULTS: No current films for review  PLAN: Present time patient is doing well with  no evidence of disease. I'm going to turn follow-up care over to medical oncology. He will probably have reimaging the next 6 months. I will be happy to reevaluate patient at any time should further treatment be indicated.  I would like to take this opportunity to thank you for allowing me to participate in the care of your patient.Noreene Filbert, MD

## 2017-10-28 ENCOUNTER — Encounter
Admission: RE | Admit: 2017-10-28 | Discharge: 2017-10-28 | Disposition: A | Discharge status: Home / Self Care | Payer: BLUE CROSS/BLUE SHIELD | Source: Ambulatory Visit | Attending: Oncology | Admitting: Oncology

## 2017-10-28 ENCOUNTER — Inpatient Hospital Stay: Payer: BLUE CROSS/BLUE SHIELD | Attending: Oncology

## 2017-10-28 DIAGNOSIS — Z923 Personal history of irradiation: Secondary | ICD-10-CM | POA: Insufficient documentation

## 2017-10-28 DIAGNOSIS — C8290 Follicular lymphoma, unspecified, unspecified site: Secondary | ICD-10-CM | POA: Insufficient documentation

## 2017-10-28 DIAGNOSIS — Z87891 Personal history of nicotine dependence: Secondary | ICD-10-CM | POA: Insufficient documentation

## 2017-10-28 LAB — COMPREHENSIVE METABOLIC PANEL
ALBUMIN: 3.8 g/dL (ref 3.5–5.0)
ALK PHOS: 64 U/L (ref 38–126)
ALT: 32 U/L (ref 17–63)
ANION GAP: 8 (ref 5–15)
AST: 25 U/L (ref 15–41)
BUN: 13 mg/dL (ref 6–20)
CALCIUM: 8.8 mg/dL — AB (ref 8.9–10.3)
CO2: 26 mmol/L (ref 22–32)
Chloride: 106 mmol/L (ref 101–111)
Creatinine, Ser: 1 mg/dL (ref 0.61–1.24)
GFR calc Af Amer: >60 mL/min (ref >60)
GFR calc non Af Amer: >60 mL/min (ref >60)
GLUCOSE: 97 mg/dL (ref 65–99)
Potassium: 3.8 mmol/L (ref 3.5–5.1)
SODIUM: 140 mmol/L (ref 135–145)
Total Bilirubin: 1.7 mg/dL — ABNORMAL HIGH (ref 0.3–1.2)
Total Protein: 6.8 g/dL (ref 6.5–8.1)

## 2017-10-28 LAB — CBC WITH DIFFERENTIAL/PLATELET
BASOS ABS: 0 10*3/uL (ref 0–0.1)
BASOS PCT: 1 %
EOS ABS: 0.1 10*3/uL (ref 0–0.7)
Eosinophils Relative: 2 %
HCT: 39.6 % — ABNORMAL LOW (ref 40.0–52.0)
HEMOGLOBIN: 13.6 g/dL (ref 13.0–18.0)
Lymphocytes Relative: 36 %
Lymphs Abs: 1.9 10*3/uL (ref 1.0–3.6)
MCH: 30.4 pg (ref 26.0–34.0)
MCHC: 34.3 g/dL (ref 32.0–36.0)
MCV: 88.5 fL (ref 80.0–100.0)
MONOS PCT: 9 %
Monocytes Absolute: 0.5 10*3/uL (ref 0.2–1.0)
NEUTROS PCT: 52 %
Neutro Abs: 2.8 10*3/uL (ref 1.4–6.5)
Platelets: 227 10*3/uL (ref 150–440)
RBC: 4.48 MIL/uL (ref 4.40–5.90)
RDW: 13.6 % (ref 11.5–14.5)
WBC: 5.2 10*3/uL (ref 3.8–10.6)

## 2017-10-28 LAB — LACTATE DEHYDROGENASE: LDH: 130 U/L (ref 98–192)

## 2017-10-28 LAB — GLUCOSE, CAPILLARY: GLUCOSE-CAPILLARY: 86 mg/dL (ref 65–99)

## 2017-10-28 MED ORDER — FLUDEOXYGLUCOSE F - 18 (FDG) INJECTION
10.5000 | Freq: Once | INTRAVENOUS | Status: AC | PRN
  Administered 2017-10-28: 10.3 via INTRAVENOUS

## 2017-10-31 NOTE — Progress Notes (Signed)
[name]  Telephone:([phone] [phone]  ID: Jonathon Jacobs OB: [birth year]  MR#: [id number]  GHW#:[id number]  Patient Care Team: Patient, No Pcp Per as PCP - General (General Practice)  CHIEF COMPLAINT: Follicular lymphoma, right superior forehead  INTERVAL HISTORY: Patient returns to clinic today for further evaluation and discussion of his imaging results. He continues to be anxious, but otherwise feels well. He has no neurologic complaints. He denies any fevers, chills, night sweats, or weight loss. He has no chest pain or shortness of breath. He denies any nausea, vomiting, constipation, or diarrhea. He has no urinary complaints. Patient offers no specific complaints today.   REVIEW OF SYSTEMS:   Review of Systems  Constitutional: Negative.  Negative for chills, diaphoresis, fever, malaise/fatigue and weight loss.  Respiratory: Negative.  Negative for cough and shortness of breath.   Cardiovascular: Negative.  Negative for chest pain and leg swelling.  Gastrointestinal: Negative.  Negative for abdominal pain.  Genitourinary: Negative.   Musculoskeletal: Negative.   Skin: Negative.  Negative for rash.  Neurological: Negative for sensory change and weakness.  Psychiatric/Behavioral: The patient is nervous/anxious.     As per HPI. Otherwise, a complete review of systems is negative.  PAST MEDICAL HISTORY: Past Medical History:  Diagnosis Date  . Allergy     PAST SURGICAL HISTORY: No past surgical history on file.  FAMILY HISTORY: Family History  Problem Relation Age of Onset  . Cervical cancer Mother   . Heart disease Mother   . Prostate cancer Father   . Dementia Maternal Grandfather     ADVANCED DIRECTIVES (Y/N):  N  HEALTH MAINTENANCE: Social History   Tobacco Use  . Smoking status: Former Research scientist (life sciences)  . Smokeless tobacco: Never Used  Substance Use Topics  . Alcohol use: No  . Drug use: No     Colonoscopy:  PAP:  Bone  density:  Lipid panel:  No Known Allergies  Current Outpatient Medications  Medication Sig Dispense Refill  . Ascorbic Acid (VITAMIN C PO) Take by mouth.    Marland Kitchen b complex vitamins capsule Take 1 capsule by mouth daily.    . Grape Seed Extract 100 MG CAPS Take by mouth.    Marland Kitchen MILK THISTLE PO Take by mouth.    Marland Kitchen UNABLE TO FIND Med Name: silca    . chlorpheniramine-HYDROcodone (TUSSIONEX) 10-8 MG/5ML SUER TAKE 2.5 TO 5MLS BY MOUTH EVERY 12 HOURS AS NEEDED  0   No current facility-administered medications for this visit.     OBJECTIVE: Vitals:   11/03/17 1110  BP: (!) 156/84  Pulse: 61  Resp: 18  Temp: 98.2 F (36.8 C)     Body mass index is 27.07 kg/m.    ECOG FS:0 - Asymptomatic  General: Well-developed, well-nourished, no acute distress. Eyes: Pink conjunctiva, anicteric sclera. HEENT: Normocephalic, moist mucous membranes, clear oropharnyx. Well-healed surgical scar right forehead. Lungs: Clear to auscultation bilaterally. Heart: Regular rate and rhythm. No rubs, murmurs, or gallops. Abdomen: Soft, nontender, nondistended. No organomegaly noted, normoactive bowel sounds. Musculoskeletal: No edema, cyanosis, or clubbing. Neuro: Alert, answering all questions appropriately. Cranial nerves grossly intact. Skin: No rashes or petechiae noted. Psych: Normal affect. Lymphatics: No cervical, calvicular, axillary or inguinal LAD.   LAB RESULTS:  Lab Results  Component Value Date   NA 140 10/28/2017   K 3.8 10/28/2017   CL 106 10/28/2017   CO2 26 10/28/2017   GLUCOSE 97 10/28/2017   BUN 13 10/28/2017   CREATININE 1.00 10/28/2017  CALCIUM 8.8 (L) 10/28/2017   PROT 6.8 10/28/2017   ALBUMIN 3.8 10/28/2017   AST 25 10/28/2017   ALT 32 10/28/2017   ALKPHOS 64 10/28/2017   BILITOT 1.7 (H) 10/28/2017   GFRNONAA >60 10/28/2017   GFRAA >60 10/28/2017    Lab Results  Component Value Date   WBC 5.2 10/28/2017   NEUTROABS 2.8 10/28/2017   HGB 13.6 10/28/2017   HCT 39.6 (L)  10/28/2017   MCV 88.5 10/28/2017   PLT 227 10/28/2017     STUDIES: Nm Pet Image Restag (ps) Skull Base To Thigh  Result Date: 10/28/2017 CLINICAL DATA:  Subsequent treatment strategy for follicular lymphoma. EXAM: NUCLEAR MEDICINE PET SKULL BASE TO THIGH TECHNIQUE: 10.3 mCi F-18 FDG was injected intravenously. Full-ring PET imaging was performed from the skull base to thigh after the radiotracer. CT data was obtained and used for attenuation correction and anatomic localization. Fasting blood glucose: 86 mg/dl Mediastinal blood pool activity: SUV max 2.0 COMPARISON:  04/28/2017 field FINDINGS: NECK: No hypermetabolic lymph nodes in the neck. Incidental CT findings: none CHEST: No hypermetabolic mediastinal or hilar nodes. No suspicious pulmonary nodules on the CT scan. Incidental CT findings: Stable 18 mm right thyroid nodule without hypermetabolism. ABDOMEN/PELVIS: No abnormal hypermetabolic activity within the liver, pancreas, adrenal glands, or spleen. No hypermetabolic lymph nodes in the abdomen or pelvis. Incidental CT findings: none SKELETON: No focal hypermetabolic activity to suggest skeletal metastasis. Incidental CT findings: none IMPRESSION: Negative PET-CT. No findings to suggest hypermetabolic lymphoma in the neck, chest, abdomen, or pelvis. Electronically Signed   By: Misty Stanley M.D.   On: 10/28/2017 13:33    ASSESSMENT: Follicular lymphoma, right superior forehead. Stage IE.  PLAN:    1. Follicular lymphoma, right superior forehead: Pathology reviewed independently revealing follicular center lymphoma with B-cell gene rearrangement. Both peripheral and deep margins were positive. Patient completed adjuvant XRT in November 2018. PET scan results from October 28, 2017 reviewed independently with no evidence of systemic disease. Patient did not require bone marrow biopsy or systemic treatment.  We will continue to monitor for recurrent disease with CT scans.  Return to clinic in 6 months  with repeat imaging and further evaluation.   Approximately 20 minutes was spent in discussion of which greater than 50% was consultation.  Patient expressed understanding and was in agreement with this plan. He also understands that He can call clinic at any time with any questions, concerns, or complaints.   Cancer Staging Follicular lymphoma ([place]) Staging form: Hodgkin and Non-Hodgkin Lymphoma, AJCC 8th Edition - Clinical stage from 8/54/8830: Stage IE (Follicular lymphoma) - Signed by Lloyd Huger, MD on 04/30/2017   Lloyd Huger, MD   11/04/2017 2:12 PM

## 2017-11-03 ENCOUNTER — Other Ambulatory Visit: Payer: Self-pay

## 2017-11-03 ENCOUNTER — Inpatient Hospital Stay (HOSPITAL_BASED_OUTPATIENT_CLINIC_OR_DEPARTMENT_OTHER): Payer: BLUE CROSS/BLUE SHIELD | Admitting: Oncology

## 2017-11-03 VITALS — BP 156/84 | HR 61 | Temp 98.2°F | Resp 18 | Wt 199.6 lb

## 2017-11-03 DIAGNOSIS — C8290 Follicular lymphoma, unspecified, unspecified site: Secondary | ICD-10-CM

## 2017-11-03 DIAGNOSIS — Z923 Personal history of irradiation: Secondary | ICD-10-CM | POA: Diagnosis not present

## 2017-11-03 NOTE — Progress Notes (Signed)
Here for follow up. Stated over all doing well  

## 2018-05-04 ENCOUNTER — Ambulatory Visit
Admission: RE | Admit: 2018-05-04 | Discharge: 2018-05-04 | Disposition: A | Discharge status: Home / Self Care | Payer: BLUE CROSS/BLUE SHIELD | Source: Ambulatory Visit | Attending: Oncology | Admitting: Oncology

## 2018-05-04 DIAGNOSIS — C8290 Follicular lymphoma, unspecified, unspecified site: Secondary | ICD-10-CM

## 2018-05-04 DIAGNOSIS — E041 Nontoxic single thyroid nodule: Secondary | ICD-10-CM | POA: Diagnosis not present

## 2018-05-04 HISTORY — DX: Follicular lymphoma, unspecified, unspecified site: C82.90

## 2018-05-04 HISTORY — DX: Essential (primary) hypertension: I10

## 2018-05-04 LAB — POCT I-STAT CREATININE: Creatinine, Ser: 1 mg/dL (ref 0.61–1.24)

## 2018-05-04 MED ORDER — IOPAMIDOL (ISOVUE-300) INJECTION 61%
100.0000 mL | Freq: Once | INTRAVENOUS | Status: AC | PRN
  Administered 2018-05-04: 100 mL via INTRAVENOUS

## 2018-05-11 ENCOUNTER — Inpatient Hospital Stay: Payer: BLUE CROSS/BLUE SHIELD | Admitting: Oncology

## 2018-05-15 NOTE — Progress Notes (Signed)
[place]  Telephone:([phone] [phone]  ID: Jonathon Jacobs OB: August 23, 1960  MR#: [id number]  QMG#:[id number]  Patient Care Team: System, Provider Not In as PCP - General  CHIEF COMPLAINT: Follicular lymphoma, right superior forehead  INTERVAL HISTORY: Patient returns to clinic today for further evaluation and discussion of his imaging results.  He currently feels well and is asymptomatic. He has no neurologic complaints. He denies any fevers, chills, night sweats, or weight loss. He has no chest pain or shortness of breath. He denies any nausea, vomiting, constipation, or diarrhea. He has no urinary complaints.  Patient feels at his baseline offers no specific complaints today.  REVIEW OF SYSTEMS:   Review of Systems  Constitutional: Negative.  Negative for chills, diaphoresis, fever, malaise/fatigue and weight loss.  Respiratory: Negative.  Negative for cough and shortness of breath.   Cardiovascular: Negative.  Negative for chest pain and leg swelling.  Gastrointestinal: Negative.  Negative for abdominal pain.  Genitourinary: Negative.  Negative for dysuria.  Musculoskeletal: Negative.  Negative for back pain.  Skin: Negative.  Negative for rash.  Neurological: Negative.  Negative for dizziness, sensory change, weakness and headaches.  Psychiatric/Behavioral: The patient is not nervous/anxious.     As per HPI. Otherwise, a complete review of systems is negative.  PAST MEDICAL HISTORY: Past Medical History:  Diagnosis Date  . Allergy   . Follicular lymphoma ([place]) 03/2017   Rad tx's   . Hypertension     PAST SURGICAL HISTORY: History reviewed. No pertinent surgical history.  FAMILY HISTORY: Family History  Problem Relation Age of Onset  . Cervical cancer Mother   . Heart disease Mother   . Prostate cancer Father   . Dementia Maternal Grandfather     ADVANCED DIRECTIVES (Y/N):  N  HEALTH MAINTENANCE: Social History   Tobacco Use  .  Smoking status: Former Research scientist (life sciences)  . Smokeless tobacco: Never Used  Substance Use Topics  . Alcohol use: No  . Drug use: No     Colonoscopy:  PAP:  Bone density:  Lipid panel:  No Known Allergies  Current Outpatient Medications  Medication Sig Dispense Refill  . Ascorbic Acid (VITAMIN C PO) Take by mouth.    Marland Kitchen b complex vitamins capsule Take 1 capsule by mouth daily.    . Grape Seed Extract 100 MG CAPS Take by mouth.    . losartan (COZAAR) 25 MG tablet Take 25 mg by mouth daily.  3  . MILK THISTLE PO Take by mouth.    Marland Kitchen UNABLE TO FIND Med Name: silca     No current facility-administered medications for this visit.     OBJECTIVE: Vitals:   05/17/18 1021  BP: (!) 147/73  Pulse: (!) 51  Resp: 20  Temp: 98.6 F (37 C)     Body mass index is 27.06 kg/m.    ECOG FS:0 - Asymptomatic  General: Well-developed, well-nourished, no acute distress. Eyes: Pink conjunctiva, anicteric sclera. HEENT: Normocephalic, moist mucous membranes, clear oropharnyx.  Well-healed surgical scar on right forehead. Lungs: Clear to auscultation bilaterally. Heart: Regular rate and rhythm. No rubs, murmurs, or gallops. Abdomen: Soft, nontender, nondistended. No organomegaly noted, normoactive bowel sounds. Musculoskeletal: No edema, cyanosis, or clubbing. Neuro: Alert, answering all questions appropriately. Cranial nerves grossly intact. Skin: No rashes or petechiae noted. Psych: Normal affect. Lymphatics: No cervical, calvicular, axillary or inguinal LAD.   LAB RESULTS:  Lab Results  Component Value Date   NA 140 10/28/2017   K 3.8  10/28/2017   CL 106 10/28/2017   CO2 26 10/28/2017   GLUCOSE 97 10/28/2017   BUN 13 10/28/2017   CREATININE 1.00 05/04/2018   CALCIUM 8.8 (L) 10/28/2017   PROT 6.8 10/28/2017   ALBUMIN 3.8 10/28/2017   AST 25 10/28/2017   ALT 32 10/28/2017   ALKPHOS 64 10/28/2017   BILITOT 1.7 (H) 10/28/2017   GFRNONAA >60 10/28/2017   GFRAA >60 10/28/2017    Lab  Results  Component Value Date   WBC 5.2 10/28/2017   NEUTROABS 2.8 10/28/2017   HGB 13.6 10/28/2017   HCT 39.6 (L) 10/28/2017   MCV 88.5 10/28/2017   PLT 227 10/28/2017     STUDIES: Ct Head W & Wo Contrast  Result Date: 05/04/2018 CLINICAL DATA:  57 year old male with treated follicular lymphoma, diagnosed August 2018. Restaging. EXAM: CT HEAD WITHOUT AND WITH CONTRAST TECHNIQUE: Contiguous axial images were obtained from the base of the skull through the vertex without and with intravenous contrast CONTRAST:  150mL ISOVUE-300 IOPAMIDOL (ISOVUE-300) INJECTION 61% COMPARISON:  PET-CT 10/28/2017 and earlier. FINDINGS: Brain: Cerebral volume is within normal limits for age. No midline shift, ventriculomegaly, mass effect, evidence of mass lesion, intracranial hemorrhage or evidence of cortically based acute infarction. Gray-white matter differentiation is within normal limits throughout the brain. No abnormal enhancement identified. Vascular: Mild Calcified atherosclerosis at the skull base. The major intracranial vascular structures are enhancing as expected. The right vertebral artery appears dominant. The Skull: Negative. Sinuses/Orbits: Visualized paranasal sinuses and mastoids are well pneumatized. Other: Visualized orbits soft tissues are within normal limits. There is a nonspecific 4 x 4 centimeter diameter by 8 millimeter thick area of confluent scalp soft tissue thickening at the posterior vertex. This was less conspicuous but seems stable since the PET-CT on 04/28/2017 (series 3, image 8 of that study). No underlying bony changes. The the remaining scalp soft tissues are within normal limits. IMPRESSION: 1. Normal CT appearance of the brain. 2. A broad-based area of vertex scalp soft tissue thickening (series 5, image 55) is nonspecific, but seems stable since the 2018 PET-CT and is presumably benign (perhaps posttraumatic scalp scarring). Correlation with physical exam is requested.  Electronically Signed   By: Genevie Ann M.D.   On: 05/04/2018 11:38   Ct Soft Tissue Neck W Contrast  Result Date: 05/04/2018 CLINICAL DATA:  57 year old male with treated follicular lymphoma, diagnosed August 2018. Restaging. EXAM: CT NECK WITH CONTRAST TECHNIQUE: Multidetector CT imaging of the neck was performed using the standard protocol following the bolus administration of intravenous contrast. CONTRAST:  158mL ISOVUE-300 IOPAMIDOL (ISOVUE-300) INJECTION 61% in conjunction with contrast enhanced imaging of the chest, abdomen, and pelvis reported separately. COMPARISON:  Head CT today reported separately. CT chest abdomen and pelvis today reported separately. PET-CT 10/28/2017 and earlier. FINDINGS: Pharynx and larynx: The glottis is closed. Laryngeal soft tissue contours are within normal limits. Pharyngeal soft tissue contours are within normal limits. Negative parapharyngeal and retropharyngeal spaces. Salivary glands: Negative sublingual space. Bilateral submandibular glands and parotid glands appear symmetric and normal. Thyroid: Mild asymmetric enlargement of the right thyroid lobe. There are several subcentimeter hypodense right lobe nodules (8 millimeters on series 2, image 97 is the largest) which do not meet size criteria for ultrasound follow-up. Lymph nodes: Negative. No cervical lymphadenopathy. Diminutive nodes throughout the bilateral neck appear normal. Vascular: Major vascular structures in the neck and at the skull base are patent and appear normal. The right vertebral artery is dominant. Limited intracranial: See head CT  today reported separately. Visualized orbits: Negative. Mastoids and visualized paranasal sinuses: Minor mucosal thickening or small retention cyst in the left maxillary alveolar recess, otherwise clear. Skeleton: Negative; mild to moderate for age lower cervical spine disc and endplate degeneration. Upper chest: Chest CT today reported separately. IMPRESSION: 1. Negative CT  appearance of the neck. 2. See also CT Head and CT Chest, Abdomen, and Pelvis studies today reported separately. Electronically Signed   By: Genevie Ann M.D.   On: 05/04/2018 11:43   Ct Chest W Contrast  Result Date: 05/04/2018 CLINICAL DATA:  Restaging for follicular lymphoma. EXAM: CT CHEST, ABDOMEN, AND PELVIS WITH CONTRAST TECHNIQUE: Multidetector CT imaging of the chest, abdomen and pelvis was performed following the standard protocol during bolus administration of intravenous contrast. CONTRAST:  132mL ISOVUE-300 IOPAMIDOL (ISOVUE-300) INJECTION 61% COMPARISON:  PET-CT 10/28/2017 FINDINGS: CT CHEST FINDINGS Cardiovascular: The heart size is normal. No substantial pericardial effusion. Mediastinum/Nodes: No mediastinal lymphadenopathy. There is no hilar lymphadenopathy. The esophagus has normal imaging features. There is no axillary lymphadenopathy. Similar appearance 20 mm right thyroid nodule. Lungs/Pleura: 2 mm right upper lobe pleural nodule (38/11) is stable in the interval and also unchanged comparing back to 04/28/2017 consistent with benign etiology. No suspicious pulmonary nodule or mass. No focal airspace consolidation. No pleural effusion. Musculoskeletal: No worrisome lytic or sclerotic osseous abnormality. CT ABDOMEN PELVIS FINDINGS Hepatobiliary: No focal abnormality within the liver parenchyma. There is no evidence for gallstones, gallbladder wall thickening, or pericholecystic fluid. No intrahepatic or extrahepatic biliary dilation. Pancreas: No focal mass lesion. No dilatation of the main duct. No intraparenchymal cyst. No peripancreatic edema. Spleen: No splenomegaly. No focal mass lesion. Adrenals/Urinary Tract: No adrenal nodule or mass. Kidneys unremarkable. No evidence for hydroureter. The urinary bladder appears normal for the degree of distention. Stomach/Bowel: Stomach is nondistended. No gastric wall thickening. No evidence of outlet obstruction. Duodenum is normally positioned as is  the ligament of Treitz. No small bowel wall thickening. No small bowel dilatation. The terminal ileum is normal. The appendix is not visualized, but there is no edema or inflammation in the region of the cecum. No gross colonic mass. No colonic wall thickening. No substantial diverticular change. Vascular/Lymphatic: No abdominal aortic aneurysm. No abdominal aortic atherosclerotic calcification. There is no gastrohepatic or hepatoduodenal ligament lymphadenopathy. No intraperitoneal or retroperitoneal lymphadenopathy. No pelvic sidewall lymphadenopathy. Reproductive: Prostate gland unremarkable. Other: No intraperitoneal free fluid. Musculoskeletal: No worrisome lytic or sclerotic osseous abnormality. IMPRESSION: 1. No evidence for lymphadenopathy in the chest, abdomen, or pelvis. 2. Similar appearance of a 20 mm right thyroid nodule. Electronically Signed   By: Misty Stanley M.D.   On: 05/04/2018 10:43   Ct Abdomen Pelvis W Contrast  Result Date: 05/04/2018 CLINICAL DATA:  Restaging for follicular lymphoma. EXAM: CT CHEST, ABDOMEN, AND PELVIS WITH CONTRAST TECHNIQUE: Multidetector CT imaging of the chest, abdomen and pelvis was performed following the standard protocol during bolus administration of intravenous contrast. CONTRAST:  189mL ISOVUE-300 IOPAMIDOL (ISOVUE-300) INJECTION 61% COMPARISON:  PET-CT 10/28/2017 FINDINGS: CT CHEST FINDINGS Cardiovascular: The heart size is normal. No substantial pericardial effusion. Mediastinum/Nodes: No mediastinal lymphadenopathy. There is no hilar lymphadenopathy. The esophagus has normal imaging features. There is no axillary lymphadenopathy. Similar appearance 20 mm right thyroid nodule. Lungs/Pleura: 2 mm right upper lobe pleural nodule (38/11) is stable in the interval and also unchanged comparing back to 04/28/2017 consistent with benign etiology. No suspicious pulmonary nodule or mass. No focal airspace consolidation. No pleural effusion. Musculoskeletal: No  worrisome lytic  or sclerotic osseous abnormality. CT ABDOMEN PELVIS FINDINGS Hepatobiliary: No focal abnormality within the liver parenchyma. There is no evidence for gallstones, gallbladder wall thickening, or pericholecystic fluid. No intrahepatic or extrahepatic biliary dilation. Pancreas: No focal mass lesion. No dilatation of the main duct. No intraparenchymal cyst. No peripancreatic edema. Spleen: No splenomegaly. No focal mass lesion. Adrenals/Urinary Tract: No adrenal nodule or mass. Kidneys unremarkable. No evidence for hydroureter. The urinary bladder appears normal for the degree of distention. Stomach/Bowel: Stomach is nondistended. No gastric wall thickening. No evidence of outlet obstruction. Duodenum is normally positioned as is the ligament of Treitz. No small bowel wall thickening. No small bowel dilatation. The terminal ileum is normal. The appendix is not visualized, but there is no edema or inflammation in the region of the cecum. No gross colonic mass. No colonic wall thickening. No substantial diverticular change. Vascular/Lymphatic: No abdominal aortic aneurysm. No abdominal aortic atherosclerotic calcification. There is no gastrohepatic or hepatoduodenal ligament lymphadenopathy. No intraperitoneal or retroperitoneal lymphadenopathy. No pelvic sidewall lymphadenopathy. Reproductive: Prostate gland unremarkable. Other: No intraperitoneal free fluid. Musculoskeletal: No worrisome lytic or sclerotic osseous abnormality. IMPRESSION: 1. No evidence for lymphadenopathy in the chest, abdomen, or pelvis. 2. Similar appearance of a 20 mm right thyroid nodule. Electronically Signed   By: Misty Stanley M.D.   On: 05/04/2018 10:43    ASSESSMENT: Follicular lymphoma, right superior forehead. Stage IE.  PLAN:    1. Follicular lymphoma, right superior forehead: Pathology reviewed independently revealing follicular center lymphoma with B-cell gene rearrangement. Both peripheral and deep margins were  positive. Patient completed adjuvant XRT in November 2018. PET scan results from October 28, 2017 reviewed independently with no evidence of systemic disease.  CT scans on May 04, 2018 reviewed independently and reported as above with no evidence of recurrent or progressive disease.  No intervention is needed at this time.  Patient can now be followed with yearly imaging and evaluation.    I spent a total of 20 minutes face-to-face with the patient of which greater than 50% of the visit was spent in counseling and coordination of care as detailed above.   Patient expressed understanding and was in agreement with this plan. He also understands that He can call clinic at any time with any questions, concerns, or complaints.   Cancer Staging Follicular lymphoma ([place]) Staging form: Hodgkin and Non-Hodgkin Lymphoma, AJCC 8th Edition - Clinical stage from 02/13/5283: Stage IE (Follicular lymphoma) - Signed by Lloyd Huger, MD on 04/30/2017   Lloyd Huger, MD   05/20/2018 2:31 PM

## 2018-05-17 ENCOUNTER — Encounter: Payer: Self-pay | Admitting: Oncology

## 2018-05-17 ENCOUNTER — Inpatient Hospital Stay: Payer: BLUE CROSS/BLUE SHIELD | Attending: Oncology | Admitting: Oncology

## 2018-05-17 VITALS — BP 147/73 | HR 51 | Temp 98.6°F | Resp 20 | Wt 199.5 lb

## 2018-05-17 DIAGNOSIS — Z79899 Other long term (current) drug therapy: Secondary | ICD-10-CM | POA: Diagnosis not present

## 2018-05-17 DIAGNOSIS — Z87891 Personal history of nicotine dependence: Secondary | ICD-10-CM | POA: Insufficient documentation

## 2018-05-17 DIAGNOSIS — Z923 Personal history of irradiation: Secondary | ICD-10-CM

## 2018-05-17 DIAGNOSIS — E041 Nontoxic single thyroid nodule: Secondary | ICD-10-CM | POA: Insufficient documentation

## 2018-05-17 DIAGNOSIS — C8290 Follicular lymphoma, unspecified, unspecified site: Secondary | ICD-10-CM

## 2018-05-17 DIAGNOSIS — I10 Essential (primary) hypertension: Secondary | ICD-10-CM | POA: Insufficient documentation

## 2018-05-17 DIAGNOSIS — C8291 Follicular lymphoma, unspecified, lymph nodes of head, face, and neck: Secondary | ICD-10-CM | POA: Diagnosis present

## 2018-05-17 NOTE — Progress Notes (Signed)
Patient denies any concerns today, here for results.   

## 2018-10-15 IMAGING — CT NM PET TUM IMG RESTAG (PS) SKULL BASE T - THIGH
1 of 9 series · 2 of 25 positions shown · non-contrast
Comparison: 04/28/2017 field

CLINICAL DATA: Subsequent treatment strategy for follicular
lymphoma.

EXAM:
NUCLEAR MEDICINE PET SKULL BASE TO THIGH
TECHNIQUE: 10.3 mCi F-18 FDG was injected intravenously. Full-ring PET imaging
was performed from the skull base to thigh after the radiotracer. CT
data was obtained and used for attenuation correction and anatomic
localization.
Fasting blood glucose: 86 mg/dl
Mediastinal blood pool activity: SUV max

[Series 3: ct wb 5.0 b30f · axial · 5.0mm · 0.98mm/px · z∈[-1450,-466]mm · 2 of 329 slices shown]
[im 1/329  brain]
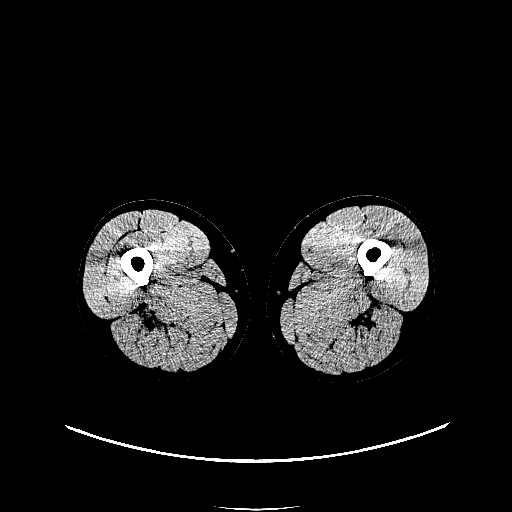
[im 329/329  brain]
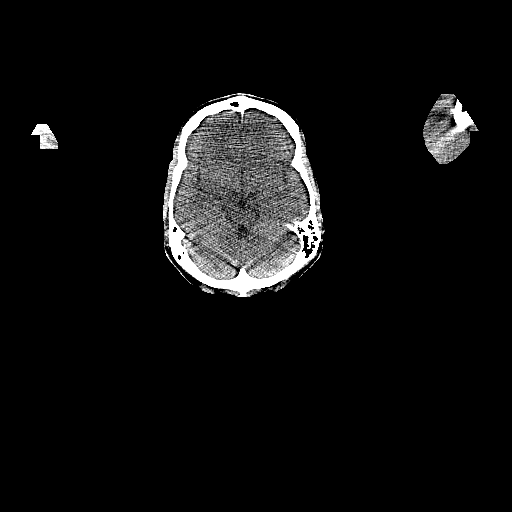

[2 of 25 positions shown; findings below may reference images not displayed]

FINDINGS: NECK: No hypermetabolic lymph nodes in the neck.

Incidental CT findings: none

CHEST: No hypermetabolic mediastinal or hilar nodes. No suspicious
pulmonary nodules on the CT scan.

Incidental CT findings: Stable 18 mm right thyroid nodule without
hypermetabolism.

ABDOMEN/PELVIS: No abnormal hypermetabolic activity within the
liver, pancreas, adrenal glands, or spleen. No hypermetabolic lymph
nodes in the abdomen or pelvis.

Incidental CT findings: none

SKELETON: No focal hypermetabolic activity to suggest skeletal
metastasis.

Incidental CT findings: none
IMPRESSION: Negative PET-CT. No findings to suggest hypermetabolic lymphoma in
the neck, chest, abdomen, or pelvis.

## 2019-05-15 ENCOUNTER — Other Ambulatory Visit: Payer: Self-pay

## 2019-05-16 ENCOUNTER — Ambulatory Visit
Admission: RE | Admit: 2019-05-16 | Discharge: 2019-05-16 | Disposition: A | Discharge status: Home / Self Care | Payer: BC Managed Care – PPO | Source: Ambulatory Visit | Attending: Oncology | Admitting: Oncology

## 2019-05-16 ENCOUNTER — Other Ambulatory Visit: Payer: Self-pay

## 2019-05-16 ENCOUNTER — Inpatient Hospital Stay: Payer: BC Managed Care – PPO | Attending: Oncology

## 2019-05-16 DIAGNOSIS — C8291 Follicular lymphoma, unspecified, lymph nodes of head, face, and neck: Secondary | ICD-10-CM | POA: Diagnosis present

## 2019-05-16 DIAGNOSIS — C8290 Follicular lymphoma, unspecified, unspecified site: Secondary | ICD-10-CM | POA: Insufficient documentation

## 2019-05-16 LAB — COMPREHENSIVE METABOLIC PANEL
ALT: 108 U/L — ABNORMAL HIGH (ref 0–44)
AST: 278 U/L — ABNORMAL HIGH (ref 15–41)
Albumin: 4.2 g/dL (ref 3.5–5.0)
Alkaline Phosphatase: 66 U/L (ref 38–126)
Anion gap: 9 (ref 5–15)
BUN: 19 mg/dL (ref 6–20)
CO2: 28 mmol/L (ref 22–32)
Calcium: 9.1 mg/dL (ref 8.9–10.3)
Chloride: 102 mmol/L (ref 98–111)
Creatinine, Ser: 1.12 mg/dL (ref 0.61–1.24)
GFR calc Af Amer: >60 mL/min (ref >60)
GFR calc non Af Amer: >60 mL/min (ref >60)
Glucose, Bld: 96 mg/dL (ref 70–99)
Potassium: 4 mmol/L (ref 3.5–5.1)
Sodium: 139 mmol/L (ref 135–145)
Total Bilirubin: 2.8 mg/dL — ABNORMAL HIGH (ref 0.3–1.2)
Total Protein: 7.5 g/dL (ref 6.5–8.1)

## 2019-05-16 LAB — CBC WITH DIFFERENTIAL/PLATELET
Abs Immature Granulocytes: 0.01 10*3/uL (ref 0.00–0.07)
Basophils Absolute: 0 10*3/uL (ref 0.0–0.1)
Basophils Relative: 1 %
Eosinophils Absolute: 0.1 10*3/uL (ref 0.0–0.5)
Eosinophils Relative: 2 %
HCT: 41.3 % (ref 39.0–52.0)
Hemoglobin: 14.2 g/dL (ref 13.0–17.0)
Immature Granulocytes: 0 %
Lymphocytes Relative: 35 %
Lymphs Abs: 1.9 10*3/uL (ref 0.7–4.0)
MCH: 29.7 pg (ref 26.0–34.0)
MCHC: 34.4 g/dL (ref 30.0–36.0)
MCV: 86.4 fL (ref 80.0–100.0)
Monocytes Absolute: 0.5 10*3/uL (ref 0.1–1.0)
Monocytes Relative: 9 %
Neutro Abs: 2.8 10*3/uL (ref 1.7–7.7)
Neutrophils Relative %: 53 %
Platelets: 191 10*3/uL (ref 150–400)
RBC: 4.78 MIL/uL (ref 4.22–5.81)
RDW: 12.3 % (ref 11.5–15.5)
WBC: 5.3 10*3/uL (ref 4.0–10.5)
nRBC: 0 % (ref 0.0–0.2)

## 2019-05-16 LAB — LACTATE DEHYDROGENASE: LDH: 528 U/L — ABNORMAL HIGH (ref 98–192)

## 2019-05-16 MED ORDER — IOHEXOL 300 MG/ML  SOLN
100.0000 mL | Freq: Once | INTRAMUSCULAR | Status: AC | PRN
  Administered 2019-05-16: 09:00:00 100 mL via INTRAVENOUS

## 2019-05-18 ENCOUNTER — Ambulatory Visit: Payer: BLUE CROSS/BLUE SHIELD | Admitting: Oncology

## 2019-05-21 NOTE — Progress Notes (Signed)
[facility]  [phone] [phone]  ID: Jonathon Jacobs OB: [birth year]  MR#: [id number]  [id number]  Patient Care Team: System, Provider Not In as PCP - General  CHIEF COMPLAINT: Follicular lymphoma, right superior forehead  INTERVAL HISTORY: Patient returns to clinic today for routine yearly evaluation and discussion of his imaging results.  He continues to feel well and remains asymptomatic. He has no neurologic complaints. He denies any fevers, chills, night sweats, or weight loss.  He denies any chest pain, shortness of breath, cough, or hemoptysis.  He denies any nausea, vomiting, constipation, or diarrhea. He has no urinary complaints.  Patient feels at his baseline offers no specific complaints today.  REVIEW OF SYSTEMS:   Review of Systems  Constitutional: Negative.  Negative for chills, diaphoresis, fever, malaise/fatigue and weight loss.  Respiratory: Negative.  Negative for cough and shortness of breath.   Cardiovascular: Negative.  Negative for chest pain and leg swelling.  Gastrointestinal: Negative.  Negative for abdominal pain.  Genitourinary: Negative.  Negative for dysuria.  Musculoskeletal: Negative.  Negative for back pain.  Skin: Negative.  Negative for rash.  Neurological: Negative.  Negative for dizziness, sensory change, weakness and headaches.  Psychiatric/Behavioral: Negative.  The patient is not nervous/anxious.     As per HPI. Otherwise, a complete review of systems is negative.  PAST MEDICAL HISTORY: Past Medical History:  Diagnosis Date  . Allergy   . Follicular lymphoma ([place]) 03/2017   Rad tx's   . Hypertension     PAST SURGICAL HISTORY: History reviewed. No pertinent surgical history.  FAMILY HISTORY: Family History  Problem Relation Age of Onset  . Cervical cancer Mother   . Heart disease Mother   . Prostate cancer Father   . Dementia Maternal Grandfather     ADVANCED DIRECTIVES (Y/N):  N   HEALTH MAINTENANCE: Social History   Tobacco Use  . Smoking status: Former Research scientist (life sciences)  . Smokeless tobacco: Never Used  Substance Use Topics  . Alcohol use: No  . Drug use: No     Colonoscopy:  PAP:  Bone density:  Lipid panel:  No Known Allergies  Current Outpatient Medications  Medication Sig Dispense Refill  . Ascorbic Acid (VITAMIN C PO) Take by mouth.    Marland Kitchen b complex vitamins capsule Take 1 capsule by mouth daily.    . hydrochlorothiazide (HYDRODIURIL) 25 MG tablet Take 25 mg by mouth daily.    Marland Kitchen losartan (COZAAR) 25 MG tablet Take 25 mg by mouth daily.  3   No current facility-administered medications for this visit.     OBJECTIVE: Vitals:   05/24/19 1030  BP: (!) 156/88  Pulse: 83  Temp: 97.8 F (36.6 C)     Body mass index is 26.65 kg/m.    ECOG FS:0 - Asymptomatic  General: Well-developed, well-nourished, no acute distress. Eyes: Pink conjunctiva, anicteric sclera. HEENT: Normocephalic, moist mucous membranes, clear oropharnyx.  Well-healed surgical scar on right forehead. Lungs: Clear to auscultation bilaterally. Heart: Regular rate and rhythm. No rubs, murmurs, or gallops. Abdomen: Soft, nontender, nondistended. No organomegaly noted, normoactive bowel sounds. Musculoskeletal: No edema, cyanosis, or clubbing. Neuro: Alert, answering all questions appropriately. Cranial nerves grossly intact. Skin: No rashes or petechiae noted. Psych: Normal affect. Lymphatics: No cervical, calvicular, axillary or inguinal LAD.   LAB RESULTS:  Lab Results  Component Value Date   NA 139 05/16/2019   K 4.0 05/16/2019   CL 102 05/16/2019   CO2 28 05/16/2019   GLUCOSE  96 05/16/2019   BUN 19 05/16/2019   CREATININE 1.12 05/16/2019   CALCIUM 9.1 05/16/2019   PROT 7.5 05/16/2019   ALBUMIN 4.2 05/16/2019   AST 278 (H) 05/16/2019   ALT 108 (H) 05/16/2019   ALKPHOS 66 05/16/2019   BILITOT 2.8 (H) 05/16/2019   GFRNONAA >60 05/16/2019   GFRAA >60 05/16/2019    Lab  Results  Component Value Date   WBC 5.3 05/16/2019   NEUTROABS 2.8 05/16/2019   HGB 14.2 05/16/2019   HCT 41.3 05/16/2019   MCV 86.4 05/16/2019   PLT 191 05/16/2019     STUDIES: Ct Head W Wo Contrast  Result Date: 05/16/2019 CLINICAL DATA:  58 year old Jonathon Jacobs with follicular lymphoma diagnosed in 2018. History of radiation treatment to the forehead where lymphoma was initially diagnosed. EXAM: CT HEAD WITHOUT AND WITH CONTRAST TECHNIQUE: Contiguous axial images were obtained from the base of the skull through the vertex without and with intravenous contrast CONTRAST:  159mL OMNIPAQUE IOHEXOL 300 MG/ML  SOLN COMPARISON:  05/04/2018. FINDINGS: Brain: Cerebral volume is stable and within normal limits. No midline shift, ventriculomegaly, mass effect, evidence of mass lesion, intracranial hemorrhage or evidence of cortically based acute infarction. Gray-white matter differentiation is within normal limits throughout the brain. No abnormal enhancement identified. Vascular: The major intracranial vascular structures are enhancing and appear to be patent. Dominant right vertebral artery and right transverse/sigmoid dural sinuses redemonstrated. Skull: Stable, negative. Sinuses/Orbits: Visualized paranasal sinuses and mastoids are clear. Other: Posterior vertex scalp soft tissue thickening has regressed since 2019 on series 8, image 27 today. Lesser multifocal scalp soft tissue thickening elsewhere appears stable (coronal series 12, image 40). Forehead and visible orbits soft tissues appear negative. IMPRESSION: 1. Stable and normal CT appearance of the brain. 2. Regressed scalp soft tissue thickening along the posterior vertex since 2019. Other nonspecific superior scalp soft tissue thickening appears stable. The forehead scalp soft tissues appear negative. Electronically Signed   By: Genevie Ann M.D.   On: 05/16/2019 23:41   Ct Soft Tissue Neck W Contrast  Result Date: 05/16/2019 CLINICAL DATA:  58 year old  Jonathon Jacobs with follicular lymphoma diagnosed in 2018. History of radiation treatment to the forehead where lymphoma was initially diagnosed. EXAM: CT NECK WITH CONTRAST TECHNIQUE: Multidetector CT imaging of the neck was performed using the standard protocol following the bolus administration of intravenous contrast. CONTRAST:  150mL OMNIPAQUE IOHEXOL 300 MG/ML  SOLN COMPARISON:  Head CT today reported separately. CT Chest, Abdomen, and Pelvis today are reported separately. Neck CT 05/04/2018. FINDINGS: Pharynx and larynx: The glottis is closed. Laryngeal and pharyngeal soft tissue contours are stable and within normal limits. Negative parapharyngeal spaces. Negative retropharyngeal space. Salivary glands: Negative sublingual space. The visible sublingual glands, submandibular glands and parotid glands appear symmetric and within normal limits. Thyroid: Stable mild enlargement of the right thyroid lobe. Occasional subcentimeter nodules do not meet size criteria for ultrasound follow-up. Lymph nodes: No cervical lymphadenopathy. Subcentimeter bilateral cervical lymph nodes appear stable since 2019. Vascular: The major vascular structures in the neck and at the skull base remain patent. No significant atherosclerosis. Limited intracranial: Reported separately today. Visualized orbits: Negative. Mastoids and visualized paranasal sinuses: Well pneumatized with small chronic left maxillary mucous retention cysts. Tympanic cavities and mastoids are clear. Skeleton: There is a small chronic periapical cyst at the posterior left maxillary molar, stable. Lower cervical spine disc and endplate degeneration. No acute or suspicious osseous lesion. Upper chest: Reported separately today. IMPRESSION: 1. Stable and negative CT appearance  of the Neck. 2. Chest and Head CT today are reported separately. Electronically Signed   By: Genevie Ann M.D.   On: 05/16/2019 23:45   Ct Chest W Contrast  Result Date: 05/16/2019 CLINICAL DATA:   Restaging follicular lymphoma, originally diagnosed along the right superior forehead, status post XRT EXAM: CT CHEST, ABDOMEN, AND PELVIS WITH CONTRAST TECHNIQUE: Multidetector CT imaging of the chest, abdomen and pelvis was performed following the standard protocol during bolus administration of intravenous contrast. CONTRAST:  128mL OMNIPAQUE IOHEXOL 300 MG/ML  SOLN COMPARISON:  CT chest abdomen pelvis dated 05/04/2018. PET-CT dated 10/28/2017. FINDINGS: CT CHEST FINDINGS Cardiovascular: Heart is normal in size.  No pericardial effusion. No evidence of thoracic aortic aneurysm. Mediastinum/Nodes: No suspicious mediastinal, hilar, or axillary lymphadenopathy. Stable 18 mm right thyroid nodule. Lungs/Pleura: No suspicious pulmonary nodules. No focal consolidation. Minimal dependent atelectasis in the bilateral lower lobes. No pleural effusion or pneumothorax. Musculoskeletal: Degenerative changes of the thoracic spine. CT ABDOMEN PELVIS FINDINGS Hepatobiliary: Liver is within normal limits. Gallbladder is unremarkable. No intrahepatic or extrahepatic duct dilatation. Pancreas: Within normal limits. Spleen: Normal in size. Adrenals/Urinary Tract: Adrenal glands are within normal limits. Kidneys are within normal limits.  No hydronephrosis. Bladder is within normal limits. Stomach/Bowel: Stomach is notable for mild wall thickening/prominent rugal folds along the greater curvature, chronic. Correlate for gastritis. No evidence of bowel obstruction. Appendix is not discretely visualized. No colonic wall thickening or mass is evident on CT. Vascular/Lymphatic: No evidence of abdominal aortic aneurysm. No suspicious abdominopelvic lymphadenopathy. Reproductive: Prostate is unremarkable. Other: No abdominopelvic ascites. Musculoskeletal: Mild degenerative changes of the upper lumbar spine. IMPRESSION: No findings suspicious for lymphoma in the chest, abdomen, or pelvis. Mild gastric wall thickening, correlate for  gastritis. Electronically Signed   By: Julian Hy M.D.   On: 05/16/2019 13:23   Ct Abdomen Pelvis W Contrast  Result Date: 05/16/2019 CLINICAL DATA:  Restaging follicular lymphoma, originally diagnosed along the right superior forehead, status post XRT EXAM: CT CHEST, ABDOMEN, AND PELVIS WITH CONTRAST TECHNIQUE: Multidetector CT imaging of the chest, abdomen and pelvis was performed following the standard protocol during bolus administration of intravenous contrast. CONTRAST:  147mL OMNIPAQUE IOHEXOL 300 MG/ML  SOLN COMPARISON:  CT chest abdomen pelvis dated 05/04/2018. PET-CT dated 10/28/2017. FINDINGS: CT CHEST FINDINGS Cardiovascular: Heart is normal in size.  No pericardial effusion. No evidence of thoracic aortic aneurysm. Mediastinum/Nodes: No suspicious mediastinal, hilar, or axillary lymphadenopathy. Stable 18 mm right thyroid nodule. Lungs/Pleura: No suspicious pulmonary nodules. No focal consolidation. Minimal dependent atelectasis in the bilateral lower lobes. No pleural effusion or pneumothorax. Musculoskeletal: Degenerative changes of the thoracic spine. CT ABDOMEN PELVIS FINDINGS Hepatobiliary: Liver is within normal limits. Gallbladder is unremarkable. No intrahepatic or extrahepatic duct dilatation. Pancreas: Within normal limits. Spleen: Normal in size. Adrenals/Urinary Tract: Adrenal glands are within normal limits. Kidneys are within normal limits.  No hydronephrosis. Bladder is within normal limits. Stomach/Bowel: Stomach is notable for mild wall thickening/prominent rugal folds along the greater curvature, chronic. Correlate for gastritis. No evidence of bowel obstruction. Appendix is not discretely visualized. No colonic wall thickening or mass is evident on CT. Vascular/Lymphatic: No evidence of abdominal aortic aneurysm. No suspicious abdominopelvic lymphadenopathy. Reproductive: Prostate is unremarkable. Other: No abdominopelvic ascites. Musculoskeletal: Mild degenerative changes  of the upper lumbar spine. IMPRESSION: No findings suspicious for lymphoma in the chest, abdomen, or pelvis. Mild gastric wall thickening, correlate for gastritis. Electronically Signed   By: Julian Hy M.D.   On: 05/16/2019 13:23  ASSESSMENT: Follicular lymphoma, right superior forehead. Stage IE.  PLAN:    1. Follicular lymphoma, right superior forehead: Pathology reviewed independently revealing follicular center lymphoma with B-cell gene rearrangement. Both peripheral and deep margins were positive. Patient completed adjuvant XRT in November 2018. PET scan results from October 28, 2017 reviewed independently with no evidence of systemic disease.  His most recent imaging with CT scan on May 16, 2019 reviewed independently and reported as above with no evidence of local recurrence or systemic disease.  No intervention is needed at this time.  Return to clinic in 1 year with repeat imaging and further evaluation.  We will continue yearly imaging until patient is 5 years removed from completing his XRT.  I spent a total of 20 minutes face-to-face with the patient of which greater than 50% of the visit was spent in counseling and coordination of care as detailed above.   Patient expressed understanding and was in agreement with this plan. He also understands that He can call clinic at any time with any questions, concerns, or complaints.   Cancer Staging Follicular lymphoma ([place]) Staging form: Hodgkin and Non-Hodgkin Lymphoma, AJCC 8th Edition - Clinical stage from [id number]: Stage IE (Follicular lymphoma) - Signed by Lloyd Huger, MD on 04/30/2017   Lloyd Huger, MD   05/27/2019 10:41 AM

## 2019-05-23 ENCOUNTER — Other Ambulatory Visit: Payer: Self-pay

## 2019-05-23 ENCOUNTER — Encounter: Payer: Self-pay | Admitting: Oncology

## 2019-05-23 NOTE — Progress Notes (Signed)
Patient pre screened for office appointment, no questions or concerns today. 

## 2019-05-24 ENCOUNTER — Other Ambulatory Visit: Payer: Self-pay

## 2019-05-24 ENCOUNTER — Inpatient Hospital Stay: Payer: BC Managed Care – PPO | Attending: Oncology | Admitting: Oncology

## 2019-05-24 VITALS — BP 156/88 | HR 83 | Temp 97.8°F | Ht 72.0 in | Wt 196.5 lb

## 2019-05-24 DIAGNOSIS — I10 Essential (primary) hypertension: Secondary | ICD-10-CM | POA: Insufficient documentation

## 2019-05-24 DIAGNOSIS — C8290 Follicular lymphoma, unspecified, unspecified site: Secondary | ICD-10-CM | POA: Diagnosis present

## 2019-05-24 DIAGNOSIS — E041 Nontoxic single thyroid nodule: Secondary | ICD-10-CM | POA: Insufficient documentation

## 2019-05-24 DIAGNOSIS — Z79899 Other long term (current) drug therapy: Secondary | ICD-10-CM | POA: Insufficient documentation

## 2019-05-24 DIAGNOSIS — Z923 Personal history of irradiation: Secondary | ICD-10-CM | POA: Insufficient documentation

## 2019-07-12 DIAGNOSIS — L820 Inflamed seborrheic keratosis: Secondary | ICD-10-CM | POA: Diagnosis not present

## 2019-07-12 DIAGNOSIS — D225 Melanocytic nevi of trunk: Secondary | ICD-10-CM | POA: Diagnosis not present

## 2019-07-12 DIAGNOSIS — L538 Other specified erythematous conditions: Secondary | ICD-10-CM | POA: Diagnosis not present

## 2019-07-12 DIAGNOSIS — D2262 Melanocytic nevi of left upper limb, including shoulder: Secondary | ICD-10-CM | POA: Diagnosis not present

## 2019-07-12 DIAGNOSIS — D2271 Melanocytic nevi of right lower limb, including hip: Secondary | ICD-10-CM | POA: Diagnosis not present

## 2019-07-12 DIAGNOSIS — D2261 Melanocytic nevi of right upper limb, including shoulder: Secondary | ICD-10-CM | POA: Diagnosis not present

## 2019-10-30 DIAGNOSIS — Z23 Encounter for immunization: Secondary | ICD-10-CM | POA: Diagnosis not present

## 2019-11-30 DIAGNOSIS — Z23 Encounter for immunization: Secondary | ICD-10-CM | POA: Diagnosis not present

## 2020-05-16 NOTE — Progress Notes (Signed)
Jonathon Jacobs  [phone] [phone]  ID: Jonathon Jacobs OB: [birth year]  MR#: [id number]  ZJQ#:[id number]  Patient Care Team: System, Provider Not In as PCP - General  CHIEF COMPLAINT: Follicular lymphoma, right superior forehead  INTERVAL HISTORY: Patient returns to clinic today for routine yearly evaluation and discussion of his imaging results.  He continues to feel well and remains asymptomatic. He has no neurologic complaints. He denies any fevers, chills, night sweats, or weight loss.  He denies any chest pain, shortness of breath, cough, or hemoptysis.  He denies any nausea, vomiting, constipation, or diarrhea. He has no urinary complaints.  Patient offers no specific complaints today.  REVIEW OF SYSTEMS:   Review of Systems  Constitutional: Negative.  Negative for chills, diaphoresis, fever, malaise/fatigue and weight loss.  Respiratory: Negative.  Negative for cough and shortness of breath.   Cardiovascular: Negative.  Negative for chest pain and leg swelling.  Gastrointestinal: Negative.  Negative for abdominal pain.  Genitourinary: Negative.  Negative for dysuria.  Musculoskeletal: Negative.  Negative for back pain.  Skin: Negative.  Negative for rash.  Neurological: Negative.  Negative for dizziness, sensory change, weakness and headaches.  Psychiatric/Behavioral: Negative.  The patient is not nervous/anxious.     As per HPI. Otherwise, a complete review of systems is negative.  PAST MEDICAL HISTORY: Past Medical History:  Diagnosis Date  . Allergy   . Follicular lymphoma ([place]) 03/2017   Rad tx's   . Hypertension     PAST SURGICAL HISTORY: No past surgical history on file.  FAMILY HISTORY: Family History  Problem Relation Age of Onset  . Cervical cancer Mother   . Heart disease Mother   . Prostate cancer Father   . Dementia Maternal Grandfather     ADVANCED DIRECTIVES (Y/N):  N  HEALTH MAINTENANCE: Social History    Tobacco Use  . Smoking status: Former Research scientist (life sciences)  . Smokeless tobacco: Never Used  Vaping Use  . Vaping Use: Never used  Substance Use Topics  . Alcohol use: No  . Drug use: No     Colonoscopy:  PAP:  Bone density:  Lipid panel:  No Known Allergies  Current Outpatient Medications  Medication Sig Dispense Refill  . Ascorbic Acid (VITAMIN C PO) Take by mouth.    Marland Kitchen b complex vitamins capsule Take 1 capsule by mouth daily.    . hydrochlorothiazide (HYDRODIURIL) 25 MG tablet Take 25 mg by mouth daily.    Marland Kitchen losartan (COZAAR) 25 MG tablet Take 25 mg by mouth daily.  3   No current facility-administered medications for this visit.    OBJECTIVE: Vitals:   05/24/20 1018  BP: (!) 150/91  Pulse: (!) 53  Resp: 20  Temp: 97.7 F (36.5 C)  SpO2: 100%     Body mass index is 26.13 kg/m.    ECOG FS:0 - Asymptomatic  General: Well-developed, well-nourished, no acute distress. Eyes: Pink conjunctiva, anicteric sclera. HEENT: Normocephalic, moist mucous membranes.  Well-healed surgical scar on right forehead. Lungs: No audible wheezing or coughing. Heart: Regular rate and rhythm. Abdomen: Soft, nontender, no obvious distention. Musculoskeletal: No edema, cyanosis, or clubbing. Neuro: Alert, answering all questions appropriately. Cranial nerves grossly intact. Skin: No rashes or petechiae noted. Psych: Normal affect.   LAB RESULTS:  Lab Results  Component Value Date   NA 139 05/24/2020   K 3.8 05/24/2020   CL 99 05/24/2020   CO2 31 05/24/2020   GLUCOSE 96 05/24/2020   BUN 17 05/24/2020  CREATININE 1.11 05/24/2020   CALCIUM 9.0 05/24/2020   PROT 7.4 05/24/2020   ALBUMIN 4.2 05/24/2020   AST 29 05/24/2020   ALT 31 05/24/2020   ALKPHOS 71 05/24/2020   BILITOT 3.3 (H) 05/24/2020   GFRNONAA >60 05/24/2020   GFRAA >60 05/16/2019    Lab Results  Component Value Date   WBC 5.1 05/24/2020   NEUTROABS 2.7 05/24/2020   HGB 15.2 05/24/2020   HCT 42.7 05/24/2020   MCV  86.3 05/24/2020   PLT 204 05/24/2020     STUDIES: CT Head W Wo Contrast  Result Date: 05/23/2020 CLINICAL DATA:  Fascicular lymphoma, follow-up; history of surgery and radiation to forehead EXAM: CT NECK WITH CONTRAST CT HEAD WITHOUT AND WITH CONTRAST TECHNIQUE: Multidetector CT imaging of the neck was performed using the standard protocol following the bolus administration of intravenous contrast. Multi detector CT imaging of the head was performed using the standard protocol before and after administration of intravenous contrast. CONTRAST:  161mL OMNIPAQUE IOHEXOL 300 MG/ML  SOLN COMPARISON:  05/16/2019 FINDINGS: CT Head Brain: There is no acute intracranial hemorrhage, mass, mass effect, or edema. No abnormal enhancement. Gray-white differentiation is preserved. There is no extra-axial fluid collection. Ventricles and sulci are within normal limits in size and configuration. Vascular: No hyperdense vessel or unexpected calcification. Skull: Calvarium is unremarkable. Orbits: No acute finding. Other: Areas of scalp soft tissue thickening at the posterior vertex and elsewhere do not appear substantially changed (best seen coronally). CT Neck Pharynx and larynx: Unremarkable.  No mass or swelling. Salivary glands: . unremarkable. Thyroid: Stable enlarged right thyroid lobe with small subcentimeter nodules. No ultrasound follow-up recommended. Lymph nodes: No enlarged or enlarging lymph nodes. Vascular: Major neck vessels are patent. Mastoids and paranasal sinuses: Aerated. Skeleton: Stable cervical spine degenerative changes. Upper chest: Better evaluated on concurrent dedicated chest imaging IMPRESSION: No significant change since 2020 imaging. No evidence of progressive disease. Electronically Signed   By: Macy Mis M.D.   On: 05/23/2020 10:21   CT SOFT TISSUE NECK W CONTRAST  Result Date: 05/23/2020 CLINICAL DATA:  Fascicular lymphoma, follow-up; history of surgery and radiation to forehead  EXAM: CT NECK WITH CONTRAST CT HEAD WITHOUT AND WITH CONTRAST TECHNIQUE: Multidetector CT imaging of the neck was performed using the standard protocol following the bolus administration of intravenous contrast. Multi detector CT imaging of the head was performed using the standard protocol before and after administration of intravenous contrast. CONTRAST:  166mL OMNIPAQUE IOHEXOL 300 MG/ML  SOLN COMPARISON:  05/16/2019 FINDINGS: CT Head Brain: There is no acute intracranial hemorrhage, mass, mass effect, or edema. No abnormal enhancement. Gray-white differentiation is preserved. There is no extra-axial fluid collection. Ventricles and sulci are within normal limits in size and configuration. Vascular: No hyperdense vessel or unexpected calcification. Skull: Calvarium is unremarkable. Orbits: No acute finding. Other: Areas of scalp soft tissue thickening at the posterior vertex and elsewhere do not appear substantially changed (best seen coronally). CT Neck Pharynx and larynx: Unremarkable.  No mass or swelling. Salivary glands: . unremarkable. Thyroid: Stable enlarged right thyroid lobe with small subcentimeter nodules. No ultrasound follow-up recommended. Lymph nodes: No enlarged or enlarging lymph nodes. Vascular: Major neck vessels are patent. Mastoids and paranasal sinuses: Aerated. Skeleton: Stable cervical spine degenerative changes. Upper chest: Better evaluated on concurrent dedicated chest imaging IMPRESSION: No significant change since 2020 imaging. No evidence of progressive disease. Electronically Signed   By: Macy Mis M.D.   On: 05/23/2020 10:21   CT Chest  W Contrast  Result Date: 05/23/2020 CLINICAL DATA:  Follicular lymphoma.  Restaging. EXAM: CT CHEST, ABDOMEN, AND PELVIS WITH CONTRAST TECHNIQUE: Multidetector CT imaging of the chest, abdomen and pelvis was performed following the standard protocol during bolus administration of intravenous contrast. CONTRAST:  134mL OMNIPAQUE IOHEXOL 300  MG/ML  SOLN COMPARISON:  05/16/2019 FINDINGS: CT CHEST FINDINGS Cardiovascular: The heart size is normal. No substantial pericardial effusion. No thoracic aortic aneurysm. Mediastinum/Nodes: No mediastinal lymphadenopathy. There is no hilar lymphadenopathy. The esophagus has normal imaging features. Right thyroid nodules measure up to 2 cm diameter. There is no axillary lymphadenopathy. Lungs/Pleura: Tiny calcified granuloma noted left upper lobe. No suspicious pulmonary nodule or mass. No focal airspace consolidation. Insert no effusions Musculoskeletal: No worrisome lytic or sclerotic osseous abnormality. CT ABDOMEN PELVIS FINDINGS Hepatobiliary: No suspicious focal abnormality within the liver parenchyma. There is no evidence for gallstones, gallbladder wall thickening, or pericholecystic fluid. No intrahepatic or extrahepatic biliary dilation. Pancreas: No focal mass lesion. No dilatation of the main duct. No intraparenchymal cyst. No peripancreatic edema. Spleen: No splenomegaly. No focal mass lesion. Adrenals/Urinary Tract: No adrenal nodule or mass. Kidneys unremarkable. No evidence for hydroureter. The urinary bladder appears normal for the degree of distention. Stomach/Bowel: Stomach is nondistended with similar suggestion of lateral wall thickening. No evidence of outlet obstruction. Duodenum is normally positioned as is the ligament of Treitz. No small bowel wall thickening. No small bowel dilatation. The terminal ileum is normal. The appendix is normal. No gross colonic mass. No colonic wall thickening. Vascular/Lymphatic: No abdominal aortic aneurysm. No abdominal aortic atherosclerotic calcification. There is no gastrohepatic or hepatoduodenal ligament lymphadenopathy. No retroperitoneal or mesenteric lymphadenopathy. No pelvic sidewall lymphadenopathy. Reproductive: The prostate gland and seminal vesicles are unremarkable. Other: No intraperitoneal free fluid. Musculoskeletal: No worrisome lytic or  sclerotic osseous abnormality. IMPRESSION: 1. No evidence for lymphadenopathy in the chest, abdomen, or pelvis to suggest recurrent lymphoma. 2. 2 cm right thyroid nodule. Recommend thyroid US. (Ref: J Am Coll Radiol. 2015 Feb;12(2): 143-50). 3. Similar appearance of mild gastric wall thickening although stomach is under distended. While nonspecific, infection/inflammation can have this appearance. Electronically Signed   By: Misty Stanley M.D.   On: 05/23/2020 09:51   CT Abdomen Pelvis W Contrast  Result Date: 05/23/2020 CLINICAL DATA:  Follicular lymphoma.  Restaging. EXAM: CT CHEST, ABDOMEN, AND PELVIS WITH CONTRAST TECHNIQUE: Multidetector CT imaging of the chest, abdomen and pelvis was performed following the standard protocol during bolus administration of intravenous contrast. CONTRAST:  123mL OMNIPAQUE IOHEXOL 300 MG/ML  SOLN COMPARISON:  05/16/2019 FINDINGS: CT CHEST FINDINGS Cardiovascular: The heart size is normal. No substantial pericardial effusion. No thoracic aortic aneurysm. Mediastinum/Nodes: No mediastinal lymphadenopathy. There is no hilar lymphadenopathy. The esophagus has normal imaging features. Right thyroid nodules measure up to 2 cm diameter. There is no axillary lymphadenopathy. Lungs/Pleura: Tiny calcified granuloma noted left upper lobe. No suspicious pulmonary nodule or mass. No focal airspace consolidation. Insert no effusions Musculoskeletal: No worrisome lytic or sclerotic osseous abnormality. CT ABDOMEN PELVIS FINDINGS Hepatobiliary: No suspicious focal abnormality within the liver parenchyma. There is no evidence for gallstones, gallbladder wall thickening, or pericholecystic fluid. No intrahepatic or extrahepatic biliary dilation. Pancreas: No focal mass lesion. No dilatation of the main duct. No intraparenchymal cyst. No peripancreatic edema. Spleen: No splenomegaly. No focal mass lesion. Adrenals/Urinary Tract: No adrenal nodule or mass. Kidneys unremarkable. No evidence for  hydroureter. The urinary bladder appears normal for the degree of distention. Stomach/Bowel: Stomach is nondistended with similar  suggestion of lateral wall thickening. No evidence of outlet obstruction. Duodenum is normally positioned as is the ligament of Treitz. No small bowel wall thickening. No small bowel dilatation. The terminal ileum is normal. The appendix is normal. No gross colonic mass. No colonic wall thickening. Vascular/Lymphatic: No abdominal aortic aneurysm. No abdominal aortic atherosclerotic calcification. There is no gastrohepatic or hepatoduodenal ligament lymphadenopathy. No retroperitoneal or mesenteric lymphadenopathy. No pelvic sidewall lymphadenopathy. Reproductive: The prostate gland and seminal vesicles are unremarkable. Other: No intraperitoneal free fluid. Musculoskeletal: No worrisome lytic or sclerotic osseous abnormality. IMPRESSION: 1. No evidence for lymphadenopathy in the chest, abdomen, or pelvis to suggest recurrent lymphoma. 2. 2 cm right thyroid nodule. Recommend thyroid US. (Ref: J Am Coll Radiol. 2015 Feb;12(2): 143-50). 3. Similar appearance of mild gastric wall thickening although stomach is under distended. While nonspecific, infection/inflammation can have this appearance. Electronically Signed   By: Misty Stanley M.D.   On: 05/23/2020 09:51    ASSESSMENT: Follicular lymphoma, right superior forehead. Stage IE.  PLAN:    1. Follicular lymphoma, right superior forehead: Pathology reviewed independently revealing follicular center lymphoma with B-cell gene rearrangement. Both peripheral and deep margins were positive. Patient completed adjuvant XRT in November 2018. PET scan results from October 28, 2017 reviewed independently with no evidence of systemic disease.  His most recent imaging with CT scan on May 23, 2020 reviewed independently and reported as above with no obvious evidence of recurrent or progressive disease.  No intervention is needed at this time.   Return to clinic in 1 year with repeat imaging and video assisted telemedicine visit.  Will continue yearly imaging until patient is 5 years removed from completing his XRT.  I spent a total of 20 minutes reviewing chart data, face-to-face evaluation with the patient, counseling and coordination of care as detailed above.   Patient expressed understanding and was in agreement with this plan. He also understands that He can call clinic at any time with any questions, concerns, or complaints.   Cancer Staging Follicular lymphoma ([place]) Staging form: Hodgkin and Non-Hodgkin Lymphoma, AJCC 8th Edition - Clinical stage from 7/93/9030: Stage IE (Follicular lymphoma) - Signed by Lloyd Huger, MD on 04/30/2017   Lloyd Huger, MD   05/24/2020 11:02 AM

## 2020-05-23 ENCOUNTER — Other Ambulatory Visit: Payer: BC Managed Care – PPO

## 2020-05-23 ENCOUNTER — Other Ambulatory Visit: Payer: Self-pay

## 2020-05-23 ENCOUNTER — Ambulatory Visit
Admission: RE | Admit: 2020-05-23 | Discharge: 2020-05-23 | Disposition: A | Discharge status: Home / Self Care | Payer: BC Managed Care – PPO | Source: Ambulatory Visit | Attending: Oncology | Admitting: Oncology

## 2020-05-23 DIAGNOSIS — C8290 Follicular lymphoma, unspecified, unspecified site: Secondary | ICD-10-CM | POA: Diagnosis not present

## 2020-05-23 DIAGNOSIS — K3189 Other diseases of stomach and duodenum: Secondary | ICD-10-CM | POA: Diagnosis not present

## 2020-05-23 DIAGNOSIS — J8410 Pulmonary fibrosis, unspecified: Secondary | ICD-10-CM | POA: Diagnosis not present

## 2020-05-23 DIAGNOSIS — C8590 Non-Hodgkin lymphoma, unspecified, unspecified site: Secondary | ICD-10-CM | POA: Diagnosis not present

## 2020-05-23 DIAGNOSIS — E042 Nontoxic multinodular goiter: Secondary | ICD-10-CM | POA: Diagnosis not present

## 2020-05-23 LAB — POCT I-STAT CREATININE: Creatinine, Ser: 1.2 mg/dL (ref 0.61–1.24)

## 2020-05-23 MED ORDER — IOHEXOL 300 MG/ML  SOLN
100.0000 mL | Freq: Once | INTRAMUSCULAR | Status: AC | PRN
  Administered 2020-05-23: 100 mL via INTRAVENOUS

## 2020-05-23 NOTE — Progress Notes (Signed)
Patient called for pre assessment. He denies any questions or concerns at this time.

## 2020-05-24 ENCOUNTER — Inpatient Hospital Stay: Payer: BC Managed Care – PPO | Attending: Oncology | Admitting: Oncology

## 2020-05-24 ENCOUNTER — Inpatient Hospital Stay: Payer: BC Managed Care – PPO

## 2020-05-24 ENCOUNTER — Encounter: Payer: Self-pay | Admitting: Oncology

## 2020-05-24 VITALS — BP 150/91 | HR 53 | Temp 97.7°F | Resp 20 | Ht 72.0 in | Wt 192.7 lb

## 2020-05-24 DIAGNOSIS — E042 Nontoxic multinodular goiter: Secondary | ICD-10-CM | POA: Insufficient documentation

## 2020-05-24 DIAGNOSIS — I10 Essential (primary) hypertension: Secondary | ICD-10-CM | POA: Diagnosis not present

## 2020-05-24 DIAGNOSIS — C8290 Follicular lymphoma, unspecified, unspecified site: Secondary | ICD-10-CM | POA: Insufficient documentation

## 2020-05-24 DIAGNOSIS — Z923 Personal history of irradiation: Secondary | ICD-10-CM | POA: Diagnosis not present

## 2020-05-24 DIAGNOSIS — Z87891 Personal history of nicotine dependence: Secondary | ICD-10-CM | POA: Insufficient documentation

## 2020-05-24 DIAGNOSIS — Z79899 Other long term (current) drug therapy: Secondary | ICD-10-CM | POA: Diagnosis not present

## 2020-05-24 LAB — COMPREHENSIVE METABOLIC PANEL
ALT: 31 U/L (ref 0–44)
AST: 29 U/L (ref 15–41)
Albumin: 4.2 g/dL (ref 3.5–5.0)
Alkaline Phosphatase: 71 U/L (ref 38–126)
Anion gap: 9 (ref 5–15)
BUN: 17 mg/dL (ref 6–20)
CO2: 31 mmol/L (ref 22–32)
Calcium: 9 mg/dL (ref 8.9–10.3)
Chloride: 99 mmol/L (ref 98–111)
Creatinine, Ser: 1.11 mg/dL (ref 0.61–1.24)
GFR calc non Af Amer: >60 mL/min (ref >60)
Glucose, Bld: 96 mg/dL (ref 70–99)
Potassium: 3.8 mmol/L (ref 3.5–5.1)
Sodium: 139 mmol/L (ref 135–145)
Total Bilirubin: 3.3 mg/dL — ABNORMAL HIGH (ref 0.3–1.2)
Total Protein: 7.4 g/dL (ref 6.5–8.1)

## 2020-05-24 LAB — CBC WITH DIFFERENTIAL/PLATELET
Abs Immature Granulocytes: 0 10*3/uL (ref 0.00–0.07)
Basophils Absolute: 0 10*3/uL (ref 0.0–0.1)
Basophils Relative: 1 %
Eosinophils Absolute: 0.1 10*3/uL (ref 0.0–0.5)
Eosinophils Relative: 2 %
HCT: 42.7 % (ref 39.0–52.0)
Hemoglobin: 15.2 g/dL (ref 13.0–17.0)
Immature Granulocytes: 0 %
Lymphocytes Relative: 35 %
Lymphs Abs: 1.8 10*3/uL (ref 0.7–4.0)
MCH: 30.7 pg (ref 26.0–34.0)
MCHC: 35.6 g/dL (ref 30.0–36.0)
MCV: 86.3 fL (ref 80.0–100.0)
Monocytes Absolute: 0.5 10*3/uL (ref 0.1–1.0)
Monocytes Relative: 10 %
Neutro Abs: 2.7 10*3/uL (ref 1.7–7.7)
Neutrophils Relative %: 52 %
Platelets: 204 10*3/uL (ref 150–400)
RBC: 4.95 MIL/uL (ref 4.22–5.81)
RDW: 12.3 % (ref 11.5–15.5)
WBC: 5.1 10*3/uL (ref 4.0–10.5)
nRBC: 0 % (ref 0.0–0.2)

## 2020-05-24 LAB — LACTATE DEHYDROGENASE: LDH: 131 U/L (ref 98–192)

## 2020-05-27 ENCOUNTER — Ambulatory Visit: Payer: BC Managed Care – PPO | Admitting: Oncology

## 2020-05-27 ENCOUNTER — Other Ambulatory Visit: Payer: BC Managed Care – PPO

## 2020-05-29 DIAGNOSIS — H5203 Hypermetropia, bilateral: Secondary | ICD-10-CM | POA: Diagnosis not present

## 2020-05-29 DIAGNOSIS — H524 Presbyopia: Secondary | ICD-10-CM | POA: Diagnosis not present

## 2020-05-29 DIAGNOSIS — H35712 Central serous chorioretinopathy, left eye: Secondary | ICD-10-CM | POA: Diagnosis not present

## 2020-05-29 DIAGNOSIS — H52223 Regular astigmatism, bilateral: Secondary | ICD-10-CM | POA: Diagnosis not present

## 2020-07-25 DIAGNOSIS — Z8572 Personal history of non-Hodgkin lymphomas: Secondary | ICD-10-CM | POA: Diagnosis not present

## 2020-07-25 DIAGNOSIS — D2261 Melanocytic nevi of right upper limb, including shoulder: Secondary | ICD-10-CM | POA: Diagnosis not present

## 2020-07-25 DIAGNOSIS — D2271 Melanocytic nevi of right lower limb, including hip: Secondary | ICD-10-CM | POA: Diagnosis not present

## 2020-07-25 DIAGNOSIS — D2262 Melanocytic nevi of left upper limb, including shoulder: Secondary | ICD-10-CM | POA: Diagnosis not present

## 2020-07-25 DIAGNOSIS — D485 Neoplasm of uncertain behavior of skin: Secondary | ICD-10-CM | POA: Diagnosis not present

## 2020-07-25 DIAGNOSIS — L986 Other infiltrative disorders of the skin and subcutaneous tissue: Secondary | ICD-10-CM | POA: Diagnosis not present

## 2020-07-25 DIAGNOSIS — C8260 Cutaneous follicle center lymphoma, unspecified site: Secondary | ICD-10-CM | POA: Diagnosis not present

## 2021-02-20 DIAGNOSIS — C8260 Cutaneous follicle center lymphoma, unspecified site: Secondary | ICD-10-CM | POA: Diagnosis not present

## 2021-02-20 DIAGNOSIS — D225 Melanocytic nevi of trunk: Secondary | ICD-10-CM | POA: Diagnosis not present

## 2021-02-20 DIAGNOSIS — D2262 Melanocytic nevi of left upper limb, including shoulder: Secondary | ICD-10-CM | POA: Diagnosis not present

## 2021-02-20 DIAGNOSIS — D2261 Melanocytic nevi of right upper limb, including shoulder: Secondary | ICD-10-CM | POA: Diagnosis not present

## 2021-02-20 DIAGNOSIS — D2271 Melanocytic nevi of right lower limb, including hip: Secondary | ICD-10-CM | POA: Diagnosis not present

## 2021-05-12 ENCOUNTER — Telehealth: Payer: Self-pay | Admitting: Oncology

## 2021-05-12 NOTE — Telephone Encounter (Signed)
Patient called to cancel his 10/6 labs and CT scan and his 10/11 MD follow-up. Pt will call back to reschedule once he completes his traveling.

## 2021-05-15 ENCOUNTER — Ambulatory Visit: Payer: BC Managed Care – PPO | Admitting: Family Medicine

## 2021-05-16 ENCOUNTER — Ambulatory Visit: Payer: BC Managed Care – PPO | Admitting: Family Medicine

## 2021-05-19 DIAGNOSIS — C8260 Cutaneous follicle center lymphoma, unspecified site: Secondary | ICD-10-CM | POA: Diagnosis not present

## 2021-05-22 ENCOUNTER — Other Ambulatory Visit: Payer: BC Managed Care – PPO

## 2021-05-22 ENCOUNTER — Ambulatory Visit: Admission: RE | Admit: 2021-05-22 | Payer: BC Managed Care – PPO | Source: Ambulatory Visit

## 2021-05-23 ENCOUNTER — Ambulatory Visit: Payer: BC Managed Care – PPO | Admitting: Family Medicine

## 2021-05-23 ENCOUNTER — Encounter: Payer: Self-pay | Admitting: Family Medicine

## 2021-05-23 ENCOUNTER — Other Ambulatory Visit: Payer: Self-pay

## 2021-05-23 VITALS — BP 144/86 | HR 50 | Temp 98.1°F | Ht 72.0 in | Wt 193.0 lb

## 2021-05-23 DIAGNOSIS — I10 Essential (primary) hypertension: Secondary | ICD-10-CM | POA: Diagnosis not present

## 2021-05-23 DIAGNOSIS — Z8042 Family history of malignant neoplasm of prostate: Secondary | ICD-10-CM | POA: Diagnosis not present

## 2021-05-23 DIAGNOSIS — Z23 Encounter for immunization: Secondary | ICD-10-CM | POA: Diagnosis not present

## 2021-05-23 DIAGNOSIS — C8290 Follicular lymphoma, unspecified, unspecified site: Secondary | ICD-10-CM | POA: Diagnosis not present

## 2021-05-23 MED ORDER — HYDROCHLOROTHIAZIDE 25 MG PO TABS
25.0000 mg | ORAL_TABLET | Freq: Every day | ORAL | 3 refills | Status: DC
Start: 2021-05-23 — End: 2021-11-25

## 2021-05-23 NOTE — Assessment & Plan Note (Signed)
Stable, chronic.  Continue current medication.    HCTZ 25 mg daily  losartan 25  mg daily

## 2021-05-23 NOTE — Assessment & Plan Note (Signed)
Acitive, followed by Dermatology at [facility].  Dr. Jasper Riling

## 2021-05-23 NOTE — Assessment & Plan Note (Signed)
PSA at CPX I 11/2020 per pt nml.

## 2021-05-23 NOTE — Patient Instructions (Signed)
It was nice meeting you today!

## 2021-05-23 NOTE — Progress Notes (Signed)
Patient ID: Jonathon Jacobs, male    DOB: Dec 14, 1960, 60 y.o.   MRN: [id number]  This visit was conducted in person.  BP (!) 144/86   Pulse (!) 50   Temp 98.1 F (36.7 C) (Temporal)   Ht 6' (1.829 m)   Wt 193 lb (87.5 kg)   SpO2 98%   BMI 26.18 kg/m    CC: Chief Complaint  Patient presents with   New Patient (Initial Visit)    Subjective:   HPI: Jonathon Jacobs is a 60 y.o. male presenting on 05/23/2021 for New Patient (Initial Visit)  Last CPX:  11/2020.. had normal blood work, nml lipids , CMET as well.  Normal PSa.   Previous PCP: Onsite at work   Follicular lymphoma on  forehead followed by Dr. Grayland Ormond  Dx in  2018 at [facility].. treated with Mohs surgery, followed by radiation. CT scan   for years unremarkable. Now followed by Jasper Riling at [facility].... currently has possible recurrence.. treated with steroid cream. Reviewed last OV in 05/19/2021.  Plan follow up  6 months.    Hypertension:    Dx few years ago,  When taking both meds BP running 130/75  Has white coat HTN. BP Readings from Last 3 Encounters:  05/23/21 (!) 144/86  05/24/20 (!) 150/91  05/24/19 (!) 156/88  Using medication without problems or lightheadedness:  none Chest pain with exertion:none Edema:none Short of breath:none Average home BPs: 130/75 Other issues:   Exercise:  orange theory 4 times a week, some weight training.  Diet: heart healthy.   Has stress at work... leads to anxiety.  Treated well with stress reduction and relaxation  as well as exercise.  Relevant past medical, surgical, family and social history reviewed and updated as indicated. Interim medical history since our last visit reviewed. Allergies and medications reviewed and updated. Outpatient Medications Prior to Visit  Medication Sig Dispense Refill   Ascorbic Acid (VITAMIN C PO) Take by mouth.     b complex vitamins capsule Take 1 capsule by mouth daily.     hydrochlorothiazide (HYDRODIURIL) 25 MG tablet Take 25 mg  by mouth daily.     losartan (COZAAR) 25 MG tablet Take 25 mg by mouth daily.  3   triamcinolone cream (KENALOG) 0.1 % Apply topically.     No facility-administered medications prior to visit.     Per HPI unless specifically indicated in ROS section below Review of Systems  Constitutional:  Negative for fatigue and fever.  HENT:  Negative for ear pain.   Eyes:  Negative for pain.  Respiratory:  Negative for cough and shortness of breath.   Cardiovascular:  Negative for chest pain, palpitations and leg swelling.  Gastrointestinal:  Negative for abdominal pain.  Genitourinary:  Negative for dysuria.  Musculoskeletal:  Negative for arthralgias.  Neurological:  Negative for syncope, light-headedness and headaches.  Psychiatric/Behavioral:  Negative for dysphoric mood.   Objective:  BP (!) 144/86   Pulse (!) 50   Temp 98.1 F (36.7 C) (Temporal)   Ht 6' (1.829 m)   Wt 193 lb (87.5 kg)   SpO2 98%   BMI 26.18 kg/m   Wt Readings from Last 3 Encounters:  05/23/21 193 lb (87.5 kg)  05/24/20 192 lb 11.2 oz (87.4 kg)  05/24/19 196 lb 8 oz (89.1 kg)      Physical Exam Constitutional:      General: He is not in acute distress.    Appearance: Normal  appearance. He is well-developed. He is not ill-appearing or toxic-appearing.  HENT:     Head: Normocephalic and atraumatic.     Right Ear: Hearing, tympanic membrane, ear canal and external ear normal.     Left Ear: Hearing, tympanic membrane, ear canal and external ear normal.     Nose: Nose normal.     Mouth/Throat:     Pharynx: Uvula midline.  Eyes:     General: Lids are normal. Lids are everted, no foreign bodies appreciated.     Conjunctiva/sclera: Conjunctivae normal.     Pupils: Pupils are equal, round, and reactive to light.  Neck:     Thyroid: No thyroid mass or thyromegaly.     Vascular: No carotid bruit.     Trachea: Trachea and phonation normal.  Cardiovascular:     Rate and Rhythm: Normal rate and regular rhythm.      Pulses: Normal pulses.     Heart sounds: S1 normal and S2 normal. No murmur heard.   No gallop.  Pulmonary:     Breath sounds: Normal breath sounds. No wheezing, rhonchi or rales.  Abdominal:     General: Bowel sounds are normal.     Palpations: Abdomen is soft.     Tenderness: There is no abdominal tenderness. There is no guarding or rebound.     Hernia: No hernia is present.  Musculoskeletal:     Cervical back: Normal range of motion and neck supple.  Lymphadenopathy:     Cervical: No cervical adenopathy.  Skin:    General: Skin is warm and dry.     Findings: No rash.  Neurological:     Mental Status: He is alert.     Cranial Nerves: No cranial nerve deficit.     Sensory: No sensory deficit.     Gait: Gait normal.     Deep Tendon Reflexes: Reflexes are normal and symmetric.  Psychiatric:        Speech: Speech normal.        Behavior: Behavior normal.        Judgment: Judgment normal.      Results for orders placed or performed in visit on 05/24/20  Lactate dehydrogenase  Result Value Ref Range   LDH 131 98 - 192 U/L  Comprehensive metabolic panel  Result Value Ref Range   Sodium 139 135 - 145 mmol/L   Potassium 3.8 3.5 - 5.1 mmol/L   Chloride 99 98 - 111 mmol/L   CO2 31 22 - 32 mmol/L   Glucose, Bld 96 70 - 99 mg/dL   BUN 17 6 - 20 mg/dL   Creatinine, Ser 1.11 0.61 - 1.24 mg/dL   Calcium 9.0 8.9 - 10.3 mg/dL   Total Protein 7.4 6.5 - 8.1 g/dL   Albumin 4.2 3.5 - 5.0 g/dL   AST 29 15 - 41 U/L   ALT 31 0 - 44 U/L   Alkaline Phosphatase 71 38 - 126 U/L   Total Bilirubin 3.3 (H) 0.3 - 1.2 mg/dL   GFR calc non Af Amer >60 >60 mL/min   Anion gap 9 5 - 15  CBC with Differential/Platelet  Result Value Ref Range   WBC 5.1 4.0 - 10.5 K/uL   RBC 4.95 4.22 - 5.81 MIL/uL   Hemoglobin 15.2 13.0 - 17.0 g/dL   HCT 42.7 39.0 - 52.0 %   MCV 86.3 80.0 - 100.0 fL   MCH 30.7 26.0 - 34.0 pg   MCHC 35.6 30.0 - 36.0 g/dL  RDW 12.3 11.5 - 15.5 %   Platelets 204 150 - 400 K/uL    nRBC 0.0 0.0 - 0.2 %   Neutrophils Relative % 52 %   Neutro Abs 2.7 1.7 - 7.7 K/uL   Lymphocytes Relative 35 %   Lymphs Abs 1.8 0.7 - 4.0 K/uL   Monocytes Relative 10 %   Monocytes Absolute 0.5 0.1 - 1.0 K/uL   Eosinophils Relative 2 %   Eosinophils Absolute 0.1 0.0 - 0.5 K/uL   Basophils Relative 1 %   Basophils Absolute 0.0 0.0 - 0.1 K/uL   Immature Granulocytes 0 %   Abs Immature Granulocytes 0.00 0.00 - 0.07 K/uL    This visit occurred during the SARS-CoV-2 public health emergency.  Safety protocols were in place, including screening questions prior to the visit, additional usage of staff PPE, and extensive cleaning of exam room while observing appropriate contact time as indicated for disinfecting solutions.   COVID 19 screen:  No recent travel or known exposure to COVID19 The patient denies respiratory symptoms of COVID 19 at this time. The importance of social distancing was discussed today.   Assessment and Plan Problem List Items Addressed This Visit     Family history of prostate cancer in father     PSA at CPX I 11/2020 per pt nml.      Follicular lymphoma ([place])     Acitive, followed by Dermatology at [facility].  Dr. Jasper Riling      HTN (hypertension), benign    Stable, chronic.  Continue current medication.    HCTZ 25 mg daily  losartan 25  mg daily      Relevant Medications   hydrochlorothiazide (HYDRODIURIL) 25 MG tablet   Other Visit Diagnoses     Need for influenza vaccination    -  Primary   Relevant Orders   Flu Vaccine QUAD 6+ mos PF IM (Fluarix Quad PF)      Meds ordered this encounter  Medications   hydrochlorothiazide (HYDRODIURIL) 25 MG tablet    Sig: Take 1 tablet (25 mg total) by mouth daily.    Dispense:  90 tablet    Refill:  3       Eliezer Lofts, MD

## 2021-05-26 ENCOUNTER — Ambulatory Visit: Payer: BC Managed Care – PPO | Admitting: Oncology

## 2021-05-26 ENCOUNTER — Telehealth: Payer: BC Managed Care – PPO | Admitting: Oncology

## 2021-05-27 ENCOUNTER — Telehealth: Payer: BC Managed Care – PPO | Admitting: Oncology

## 2021-11-06 ENCOUNTER — Telehealth: Payer: Self-pay | Admitting: Family Medicine

## 2021-11-06 DIAGNOSIS — Z1159 Encounter for screening for other viral diseases: Secondary | ICD-10-CM

## 2021-11-06 DIAGNOSIS — Z8042 Family history of malignant neoplasm of prostate: Secondary | ICD-10-CM

## 2021-11-06 DIAGNOSIS — I10 Essential (primary) hypertension: Secondary | ICD-10-CM

## 2021-11-06 NOTE — Telephone Encounter (Signed)
-----   Message from Velna Hatchet, RT sent at 11/05/2021 11:23 AM EDT ----- ?Regarding: Lab order for Tuesday, 11/18/21 ?Patient is scheduled for cpx, please order future labs.  Thanks, Anda Kraft  ? ?

## 2021-11-18 ENCOUNTER — Other Ambulatory Visit (INDEPENDENT_AMBULATORY_CARE_PROVIDER_SITE_OTHER): Payer: BC Managed Care – PPO

## 2021-11-18 DIAGNOSIS — Z1159 Encounter for screening for other viral diseases: Secondary | ICD-10-CM | POA: Diagnosis not present

## 2021-11-18 DIAGNOSIS — E559 Vitamin D deficiency, unspecified: Secondary | ICD-10-CM

## 2021-11-18 DIAGNOSIS — I10 Essential (primary) hypertension: Secondary | ICD-10-CM

## 2021-11-18 DIAGNOSIS — Z8042 Family history of malignant neoplasm of prostate: Secondary | ICD-10-CM

## 2021-11-18 LAB — COMPREHENSIVE METABOLIC PANEL
ALT: 19 U/L (ref 0–53)
AST: 21 U/L (ref 0–37)
Albumin: 4.1 g/dL (ref 3.5–5.2)
Alkaline Phosphatase: 72 U/L (ref 39–117)
BUN: 14 mg/dL (ref 6–23)
CO2: 31 mEq/L (ref 19–32)
Calcium: 9.3 mg/dL (ref 8.4–10.5)
Chloride: 105 mEq/L (ref 96–112)
Creatinine, Ser: 1.1 mg/dL (ref 0.40–1.50)
GFR: 73.29 mL/min (ref >60.00)
Glucose, Bld: 91 mg/dL (ref 70–99)
Potassium: 4.3 mEq/L (ref 3.5–5.1)
Sodium: 141 mEq/L (ref 135–145)
Total Bilirubin: 1.9 mg/dL — ABNORMAL HIGH (ref 0.2–1.2)
Total Protein: 6.4 g/dL (ref 6.0–8.3)

## 2021-11-18 LAB — VITAMIN D 25 HYDROXY (VIT D DEFICIENCY, FRACTURES): VITD: 33.53 ng/mL (ref 30.00–100.00)

## 2021-11-18 LAB — LIPID PANEL
Cholesterol: 151 mg/dL (ref 0–200)
HDL: 53.3 mg/dL (ref >39.00)
LDL Cholesterol: 87 mg/dL (ref 0–99)
NonHDL: 98.19
Total CHOL/HDL Ratio: 3
Triglycerides: 56 mg/dL (ref 0.0–149.0)
VLDL: 11.2 mg/dL (ref 0.0–40.0)

## 2021-11-18 LAB — PSA: PSA: 1.63 ng/mL (ref 0.10–4.00)

## 2021-11-18 NOTE — Addendum Note (Signed)
Addended by: Ellamae Sia on: 11/18/2021 07:37 AM ? ? Modules accepted: Orders ? ?

## 2021-11-19 LAB — HEPATITIS C ANTIBODY
Hepatitis C Ab: NONREACTIVE
SIGNAL TO CUT-OFF: 0.08 (ref <1.00)

## 2021-11-19 NOTE — Progress Notes (Signed)
No critical labs need to be addressed urgently. We will discuss labs in detail at upcoming office visit.   

## 2021-11-25 ENCOUNTER — Ambulatory Visit (INDEPENDENT_AMBULATORY_CARE_PROVIDER_SITE_OTHER): Payer: BC Managed Care – PPO | Admitting: Family Medicine

## 2021-11-25 VITALS — BP 138/86 | HR 48 | Temp 97.7°F | Resp 12 | Ht 72.0 in | Wt 191.2 lb

## 2021-11-25 DIAGNOSIS — C8290 Follicular lymphoma, unspecified, unspecified site: Secondary | ICD-10-CM

## 2021-11-25 DIAGNOSIS — Z1211 Encounter for screening for malignant neoplasm of colon: Secondary | ICD-10-CM

## 2021-11-25 DIAGNOSIS — I10 Essential (primary) hypertension: Secondary | ICD-10-CM | POA: Diagnosis not present

## 2021-11-25 DIAGNOSIS — Z0000 Encounter for general adult medical examination without abnormal findings: Secondary | ICD-10-CM | POA: Diagnosis not present

## 2021-11-25 NOTE — Patient Instructions (Addendum)
Try losartan 1/2 tab of 25 mg daily.. follow BP at home.. Goal < 140/90 but more ideally around 120/70. ?  Can try OTC vit D supplement 1000 IU daily ? Consider shingrix vaccine. ?

## 2021-11-25 NOTE — Progress Notes (Signed)
? ? Patient ID: Jonathon Jacobs, male    DOB: 12/17/1960, 61 y.o.   MRN: [id number] ? ?This visit was conducted in person. ? ?BP 138/86   Pulse (!) 48   Temp 97.7 ?F (36.5 ?C)   Resp 12   Ht 6' (1.829 m)   Wt 191 lb 4 oz (86.8 kg)   SpO2 98%   BMI 25.94 kg/m?   ? ?CC:  ?Chief Complaint  ?Patient presents with  ? Annual Exam  ?  With employer- received tetanus vaccine about 6 years ago-2017.  ? ? ?Subjective:  ? ?HPI: ?Jonathon Jacobs is a 61 y.o. male presenting on 11/25/2021 for Annual Exam (With employer- received tetanus vaccine about 6 years ago-2017.) ? ?Hypertension:  Well controlled   No longer on HCTZ 25 mg daily, losartan  25 mg daily ?Has improved since stopping sodium bicarb. ?BP Readings from Last 3 Encounters:  ?11/25/21 138/86  ?05/23/21 (!) 144/86  ?05/24/20 (!) 150/91  ?Using medication without problems or lightheadedness:  none ?Chest pain with exertion: none ?Edema: none ?Short of breath: none ?Average home BPs: ?Other issues: ? ?Reviewed labs in detail ? ?Lab Results  ?Component Value Date  ? CHOL 151 11/18/2021  ? HDL 53.30 11/18/2021  ? [place] 87 11/18/2021  ? TRIG 56.0 11/18/2021  ? CHOLHDL 3 11/18/2021  ? ? ?The 10-year ASCVD risk score (Arnett DK, et al., 2019) is: 7.7% ?  Values used to calculate the score: ?    Age: 75 years ?    Sex: Male ?    Is Non-Hispanic African American: No ?    Diabetic: No ?    Tobacco smoker: No ?    Systolic Blood Pressure: 341 mmHg ?    Is BP treated: Yes ?    HDL Cholesterol: 53.3 mg/dL ?    Total Cholesterol: 151 mg/dL ? ? Exercise:  frequently ? Diet good ?Vit D nml ? ? Follicular lymphoma followed by Dr. [facility] in past and now [facility] Dr. Jasper Riling. ?   ? ?Relevant past medical, surgical, family and social history reviewed and updated as indicated. Interim medical history since our last visit reviewed. ?Allergies and medications reviewed and updated. ?Outpatient Medications Prior to Visit  ?Medication Sig Dispense Refill  ? Ascorbic Acid (VITAMIN  C PO) Take by mouth.    ? b complex vitamins capsule Take 1 capsule by mouth daily.    ? hydrochlorothiazide (HYDRODIURIL) 25 MG tablet Take 1 tablet (25 mg total) by mouth daily. 90 tablet 3  ? losartan (COZAAR) 25 MG tablet Take 25 mg by mouth daily.  3  ? ?No facility-administered medications prior to visit.  ?  ? ?Per HPI unless specifically indicated in ROS section below ?Review of Systems  ?Constitutional:  Negative for fatigue and fever.  ?HENT:  Negative for ear pain.   ?Eyes:  Negative for pain.  ?Respiratory:  Negative for cough and shortness of breath.   ?Cardiovascular:  Negative for chest pain, palpitations and leg swelling.  ?Gastrointestinal:  Negative for abdominal pain.  ?Genitourinary:  Negative for dysuria.  ?Musculoskeletal:  Negative for arthralgias.  ?Neurological:  Negative for syncope, light-headedness and headaches.  ?Psychiatric/Behavioral:  Negative for dysphoric mood.   ?Objective:  ?BP 138/86   Pulse (!) 48   Temp 97.7 ?F (36.5 ?C)   Resp 12   Ht 6' (1.829 m)   Wt 191 lb 4 oz (86.8 kg)   SpO2 98%   BMI 25.94  kg/m?   ?Wt Readings from Last 3 Encounters:  ?11/25/21 191 lb 4 oz (86.8 kg)  ?05/23/21 193 lb (87.5 kg)  ?05/24/20 192 lb 11.2 oz (87.4 kg)  ?  ?  ?Physical Exam ?Constitutional:   ?   Appearance: He is well-developed.  ?HENT:  ?   Head: Normocephalic.  ?   Right Ear: Hearing normal.  ?   Left Ear: Hearing normal.  ?   Nose: Nose normal.  ?Neck:  ?   Thyroid: No thyroid mass or thyromegaly.  ?   Vascular: No carotid bruit.  ?   Trachea: Trachea normal.  ?Cardiovascular:  ?   Rate and Rhythm: Normal rate and regular rhythm.  ?   Pulses: Normal pulses.  ?   Heart sounds: Heart sounds not distant. No murmur heard. ?  No friction rub. No gallop.  ?   Comments: No peripheral edema ?Pulmonary:  ?   Effort: Pulmonary effort is normal. No respiratory distress.  ?   Breath sounds: Normal breath sounds.  ?Skin: ?   General: Skin is warm and dry.  ?   Findings: No rash.  ?Psychiatric:      ?   Speech: Speech normal.     ?   Behavior: Behavior normal.     ?   Thought Content: Thought content normal.  ? ?   ?Results for orders placed or performed in visit on 11/18/21  ?PSA  ?Result Value Ref Range  ? PSA 1.63 0.10 - 4.00 ng/mL  ?Hepatitis C antibody  ?Result Value Ref Range  ? Hepatitis C Ab NON-REACTIVE NON-REACTIVE  ? SIGNAL TO CUT-OFF 0.08 <1.00  ?Lipid panel  ?Result Value Ref Range  ? Cholesterol 151 0 - 200 mg/dL  ? Triglycerides 56.0 0.0 - 149.0 mg/dL  ? HDL 53.30 >39.00 mg/dL  ? VLDL 11.2 0.0 - 40.0 mg/dL  ? LDL Cholesterol 87 0 - 99 mg/dL  ? Total CHOL/HDL Ratio 3   ? NonHDL 98.19   ?Comprehensive metabolic panel  ?Result Value Ref Range  ? Sodium 141 135 - 145 mEq/L  ? Potassium 4.3 3.5 - 5.1 mEq/L  ? Chloride 105 96 - 112 mEq/L  ? CO2 31 19 - 32 mEq/L  ? Glucose, Bld 91 70 - 99 mg/dL  ? BUN 14 6 - 23 mg/dL  ? Creatinine, Ser 1.10 0.40 - 1.50 mg/dL  ? Total Bilirubin 1.9 (H) 0.2 - 1.2 mg/dL  ? Alkaline Phosphatase 72 39 - 117 U/L  ? AST 21 0 - 37 U/L  ? ALT 19 0 - 53 U/L  ? Total Protein 6.4 6.0 - 8.3 g/dL  ? Albumin 4.1 3.5 - 5.2 g/dL  ? GFR 73.29 >60.00 mL/min  ? Calcium 9.3 8.4 - 10.5 mg/dL  ?VITAMIN D 25 Hydroxy (Vit-D Deficiency, Fractures)  ?Result Value Ref Range  ? VITD 33.53 30.00 - 100.00 ng/mL  ? ? ?This visit occurred during the SARS-CoV-2 public health emergency.  Safety protocols were in place, including screening questions prior to the visit, additional usage of staff PPE, and extensive cleaning of exam room while observing appropriate contact time as indicated for disinfecting solutions.  ? ?COVID 19 screen:  No recent travel or known exposure to [place] ?The patient denies respiratory symptoms of COVID 19 at this time. ?The importance of social distancing was discussed today.  ? ?Assessment and Plan ?The patient's preventative maintenance and recommended screening tests for an annual wellness exam were reviewed in full today. ?Brought up  to date unless services  declined. ? ?Counselled on the importance of diet, exercise, and its role in overall health and mortality. ?The patient's FH and SH was reviewed, including their home life, tobacco status, and drug and alcohol status.  ? ?Vaccines: Consider Shingrix. Had Td at work ?Prostate Cancer Screen:  ?Lab Results  ?Component Value Date  ? PSA 1.63 11/18/2021  ?Colon Cancer Screen:   virtual colonoscopy ive skin cancer ( CT scan for 4 years). Will start ifob this year.  ?  ?  ?Smoking Status: former smoker ?ETOH/ drug use: none.none ? Hep C:  done ? HIV screen:   refused ? ?Problem List Items Addressed This Visit   ? ? Follicular lymphoma ([place])  ?  followed by Dr. [facility] in past and now [facility] Dr. Jasper Riling. ?  ?  ? HTN (hypertension), benign  ?  Well-controlled on no medication ? ?  ?  ? Relevant Medications  ? losartan (COZAAR) 25 MG tablet  ? ?Other Visit Diagnoses   ? ? Routine general medical examination at a health care facility    -  Primary  ? Colon cancer screening      ? Relevant Orders  ? Fecal occult blood, imunochemical (Completed)  ? ?  ? ? ?  ? ?Eliezer Lofts, MD  ? ?

## 2021-12-02 ENCOUNTER — Other Ambulatory Visit (INDEPENDENT_AMBULATORY_CARE_PROVIDER_SITE_OTHER): Payer: BC Managed Care – PPO

## 2021-12-02 DIAGNOSIS — Z1211 Encounter for screening for malignant neoplasm of colon: Secondary | ICD-10-CM

## 2021-12-02 LAB — FECAL OCCULT BLOOD, IMMUNOCHEMICAL: Fecal Occult Bld: NEGATIVE

## 2021-12-03 DIAGNOSIS — X32XXXA Exposure to sunlight, initial encounter: Secondary | ICD-10-CM | POA: Diagnosis not present

## 2021-12-03 DIAGNOSIS — D2261 Melanocytic nevi of right upper limb, including shoulder: Secondary | ICD-10-CM | POA: Diagnosis not present

## 2021-12-03 DIAGNOSIS — D2262 Melanocytic nevi of left upper limb, including shoulder: Secondary | ICD-10-CM | POA: Diagnosis not present

## 2021-12-03 DIAGNOSIS — D2272 Melanocytic nevi of left lower limb, including hip: Secondary | ICD-10-CM | POA: Diagnosis not present

## 2021-12-03 DIAGNOSIS — L570 Actinic keratosis: Secondary | ICD-10-CM | POA: Diagnosis not present

## 2021-12-03 DIAGNOSIS — D225 Melanocytic nevi of trunk: Secondary | ICD-10-CM | POA: Diagnosis not present

## 2021-12-22 NOTE — Assessment & Plan Note (Signed)
Well controlled on no medication  

## 2021-12-22 NOTE — Assessment & Plan Note (Signed)
followed by Dr. [facility] in past and now [facility] Dr. Jasper Riling. ?

## 2022-01-21 DIAGNOSIS — L308 Other specified dermatitis: Secondary | ICD-10-CM | POA: Diagnosis not present

## 2022-03-03 ENCOUNTER — Institutional Professional Consult (permissible substitution): Payer: BC Managed Care – PPO | Admitting: Licensed Clinical Social Worker

## 2022-03-04 ENCOUNTER — Ambulatory Visit: Payer: BC Managed Care – PPO | Admitting: Licensed Clinical Social Worker

## 2022-03-04 DIAGNOSIS — Z6379 Other stressful life events affecting family and household: Secondary | ICD-10-CM | POA: Diagnosis not present

## 2022-03-04 NOTE — BH Specialist Note (Signed)
Integrated Behavioral Health Initial In-Person Visit  MRN: [id number] Name: Jonathon Jacobs  Number of [place] Clinician visits: 1- Initial Visit  Session Start time: 0830    Session End time: 0923  Total time in minutes: 53   Types of Service: Individual psychotherapy  Interpretor:No. Interpretor Name and Language: NA    Warm Hand Off Completed.        Subjective: Jonathon Jacobs is a 61 y.o. male accompanied by  Self Patient was referred by Dr. Sharene Butters for Caregiver [facility] and [employer] . Patient reports the following symptoms/concerns: feelings of of strain with caring for mother who has cognitive impairment, in addition frustration of situation and needing guidance on dealing with mother's behavior and needing support .   Duration of problem: Several months; Severity of problem: moderate  Objective: Mood:  Reports feeling stressed   and Affect: Appropriate Risk of harm to self or others: No plan to harm self or others  Life Context: Family and Social: Pt is married but currently living with his mother and providing care for her in her home due to fall risk and cognitive issues  School/Work: Pt does work .  Self-Care: Disused the need and importance for caring for the caregiver for his health and wellbeing as well.   Life Changes: Being the primary caregiver for his mother and dealing with her medical issues and cognitive changes   Patient and/or Family's Strengths/Protective Factors: Concrete supports in place (healthy food, safe environments, etc.)  Goals Addressed: Patient will: Reduce symptoms of: stress Increase knowledge and/or ability of: coping skills and stress reduction  Demonstrate ability to: Increase healthy adjustment to current life circumstances and Increase adequate support systems for patient/family  Progress towards Goals: Ongoing  Interventions: Interventions utilized: Supportive Counseling and Link to  [employer]  Standardized Assessments completed: Not Needed  Patient and/or Family Response: Pt very open to supportive counseling and resources provided .  Pt open to increase the formal and informal and formal support system . In addition, gain knowledge about mother condition to see what might work with with her and what might not and also challenges he is faces and that are ahead.    Patient Centered Plan: Patient is on the following Treatment Plan(s):  Discussed moving options with his mother and the best way to approach it.  Provide safety and security and as much independence as possible for his mother. Link up with community resources, and give importance to care for caregiver himself.   Assessment: Patient currently experiencing Patient reports the following symptoms/concerns: feelings of of strain with caring for mother who has cognitive impairment, in addition frustration of situation and needing guidance on dealing with mother's behavior and needing support . Marland Kitchen   Patient may benefit from supportive counseling and resources provided .  Pt open to increase the formal and informal and formal support system . In addition, gain knowledge about mother condition to see what might work with with her and what might not and also challenges he is faces and that are ahead.  Look at current living arrangement of his mother and consider options what would his mother would be open to in home care or move in with family and less anxious suspicious too, but also provide safety and wellbeing .  In addition link up discussed community resources and caring for caregiver .   Plan: Follow up with behavioral health clinician on : As needed  Behavioral recommendations: Discussed above follow  up with support group, recommend 36 hour ALZ book, Teep Snow, ALZ website , [employer], [employer] In home care if needed  Referral(s): ALZ book, Teep Emogene Morgan, ALZ website , Elder Law, [employer]  , In home care if needed  "From scale of 1-10, how likely are you to follow plan?": 10  Traniya Prichett A Taylor-Paladino, LCSW

## 2022-03-04 NOTE — Patient Instructions (Signed)
Plan: Follow up with behavioral health clinician on : As needed  Behavioral recommendations: Discussed above follow up with support group, recommend 36 hour ALZ book, Teep Snow, ALZ website , Elder Law, [employer] In home care if needed  Referral(s): ALZ book, Delorse Lek, ALZ website , Elder Law, [employer] , In home care if needed

## 2022-08-18 DIAGNOSIS — H5203 Hypermetropia, bilateral: Secondary | ICD-10-CM | POA: Diagnosis not present

## 2022-08-18 DIAGNOSIS — H35712 Central serous chorioretinopathy, left eye: Secondary | ICD-10-CM | POA: Diagnosis not present

## 2022-08-18 DIAGNOSIS — H52223 Regular astigmatism, bilateral: Secondary | ICD-10-CM | POA: Diagnosis not present

## 2022-08-18 DIAGNOSIS — H3562 Retinal hemorrhage, left eye: Secondary | ICD-10-CM | POA: Diagnosis not present

## 2022-08-21 ENCOUNTER — Other Ambulatory Visit: Payer: Self-pay

## 2022-08-21 ENCOUNTER — Telehealth: Payer: Self-pay | Admitting: Family Medicine

## 2022-08-21 MED ORDER — LOSARTAN POTASSIUM 25 MG PO TABS: 12.5000 mg | ORAL_TABLET | Freq: Every day | ORAL | 0 refills | Status: DC

## 2022-08-21 NOTE — Telephone Encounter (Signed)
Rx refill sent to provider

## 2022-08-21 NOTE — Telephone Encounter (Signed)
Prescription Request  08/21/2022  Is this a "Controlled Substance" medicine? No  LOV: 11/25/21  What is the name of the medication or equipment? losartan (COZAAR) 25 MG tablet    Have you contacted your pharmacy to request a refill? Yes   Which pharmacy would you like this sent to?  [id number] [place] [facility] [address] [place] [address]Phone: [phone]: [phone]   Patient notified that their request is being sent to the clinical staff for review and that they should receive a response within 2 business days.   Please advise at Mobile [phone](mobile)

## 2022-08-21 NOTE — Telephone Encounter (Signed)
Last visit 11/2021 Next come back in 1 year for CPE

## 2022-09-21 DIAGNOSIS — C8269 Cutaneous follicle center lymphoma, extranodal and solid organ sites: Secondary | ICD-10-CM | POA: Diagnosis not present

## 2022-10-15 DIAGNOSIS — C8269 Cutaneous follicle center lymphoma, extranodal and solid organ sites: Secondary | ICD-10-CM | POA: Diagnosis not present

## 2022-10-28 DIAGNOSIS — C8269 Cutaneous follicle center lymphoma, extranodal and solid organ sites: Secondary | ICD-10-CM | POA: Diagnosis not present

## 2022-11-11 DIAGNOSIS — C8269 Cutaneous follicle center lymphoma, extranodal and solid organ sites: Secondary | ICD-10-CM | POA: Diagnosis not present

## 2022-11-12 DIAGNOSIS — C8269 Cutaneous follicle center lymphoma, extranodal and solid organ sites: Secondary | ICD-10-CM | POA: Diagnosis not present

## 2022-11-13 DIAGNOSIS — C8269 Cutaneous follicle center lymphoma, extranodal and solid organ sites: Secondary | ICD-10-CM | POA: Diagnosis not present

## 2022-12-22 DIAGNOSIS — H3562 Retinal hemorrhage, left eye: Secondary | ICD-10-CM | POA: Diagnosis not present

## 2022-12-22 DIAGNOSIS — H35712 Central serous chorioretinopathy, left eye: Secondary | ICD-10-CM | POA: Diagnosis not present

## 2022-12-30 DIAGNOSIS — C8260 Cutaneous follicle center lymphoma, unspecified site: Secondary | ICD-10-CM | POA: Diagnosis not present

## 2022-12-30 DIAGNOSIS — D2262 Melanocytic nevi of left upper limb, including shoulder: Secondary | ICD-10-CM | POA: Diagnosis not present

## 2022-12-30 DIAGNOSIS — L538 Other specified erythematous conditions: Secondary | ICD-10-CM | POA: Diagnosis not present

## 2022-12-30 DIAGNOSIS — D2261 Melanocytic nevi of right upper limb, including shoulder: Secondary | ICD-10-CM | POA: Diagnosis not present

## 2022-12-30 DIAGNOSIS — L820 Inflamed seborrheic keratosis: Secondary | ICD-10-CM | POA: Diagnosis not present

## 2022-12-30 DIAGNOSIS — D2272 Melanocytic nevi of left lower limb, including hip: Secondary | ICD-10-CM | POA: Diagnosis not present

## 2022-12-30 DIAGNOSIS — D225 Melanocytic nevi of trunk: Secondary | ICD-10-CM | POA: Diagnosis not present

## 2023-01-21 DIAGNOSIS — C8269 Cutaneous follicle center lymphoma, extranodal and solid organ sites: Secondary | ICD-10-CM | POA: Diagnosis not present

## 2023-02-09 ENCOUNTER — Telehealth: Payer: Self-pay | Admitting: *Deleted

## 2023-02-09 DIAGNOSIS — I10 Essential (primary) hypertension: Secondary | ICD-10-CM

## 2023-02-09 DIAGNOSIS — Z8042 Family history of malignant neoplasm of prostate: Secondary | ICD-10-CM

## 2023-02-09 NOTE — Telephone Encounter (Signed)
-----   Message from Alvina Chou sent at 02/09/2023 11:13 AM EDT ----- Regarding: Lab orders for Tuesday, 7.2.24 Patient is scheduled for CPX labs, please order future labs, Thanks , Camelia Eng

## 2023-02-16 ENCOUNTER — Other Ambulatory Visit (INDEPENDENT_AMBULATORY_CARE_PROVIDER_SITE_OTHER): Payer: BC Managed Care – PPO

## 2023-02-16 DIAGNOSIS — Z8042 Family history of malignant neoplasm of prostate: Secondary | ICD-10-CM | POA: Diagnosis not present

## 2023-02-16 DIAGNOSIS — I10 Essential (primary) hypertension: Secondary | ICD-10-CM | POA: Diagnosis not present

## 2023-02-16 LAB — COMPREHENSIVE METABOLIC PANEL
ALT: 21 U/L (ref 0–53)
AST: 23 U/L (ref 0–37)
Albumin: 4.1 g/dL (ref 3.5–5.2)
Alkaline Phosphatase: 73 U/L (ref 39–117)
BUN: 14 mg/dL (ref 6–23)
CO2: 27 mEq/L (ref 19–32)
Calcium: 9.3 mg/dL (ref 8.4–10.5)
Chloride: 105 mEq/L (ref 96–112)
Creatinine, Ser: 1.03 mg/dL (ref 0.40–1.50)
GFR: 78.61 mL/min (ref >60.00)
Glucose, Bld: 91 mg/dL (ref 70–99)
Potassium: 4 mEq/L (ref 3.5–5.1)
Sodium: 140 mEq/L (ref 135–145)
Total Bilirubin: 1.8 mg/dL — ABNORMAL HIGH (ref 0.2–1.2)
Total Protein: 6.9 g/dL (ref 6.0–8.3)

## 2023-02-16 LAB — LIPID PANEL
Cholesterol: 169 mg/dL (ref 0–200)
HDL: 67.8 mg/dL (ref >39.00)
LDL Cholesterol: 92 mg/dL (ref 0–99)
NonHDL: 100.9
Total CHOL/HDL Ratio: 2
Triglycerides: 45 mg/dL (ref 0.0–149.0)
VLDL: 9 mg/dL (ref 0.0–40.0)

## 2023-02-16 LAB — PSA: PSA: 1.97 ng/mL (ref 0.10–4.00)

## 2023-02-16 NOTE — Progress Notes (Signed)
No critical labs need to be addressed urgently. We will discuss labs in detail at upcoming office visit.   

## 2023-02-19 ENCOUNTER — Other Ambulatory Visit: Payer: Self-pay | Admitting: Family Medicine

## 2023-02-19 NOTE — Telephone Encounter (Signed)
Please call and schedule CPE with fasting labs prior with Dr. Ermalene Searing.  Please send back to me once scheduled to refill medication.

## 2023-02-19 NOTE — Telephone Encounter (Signed)
Pt has cpe already scheduled for 02/25/23 @ 8:40 AM

## 2023-02-25 ENCOUNTER — Ambulatory Visit (INDEPENDENT_AMBULATORY_CARE_PROVIDER_SITE_OTHER): Payer: BC Managed Care – PPO | Admitting: Family Medicine

## 2023-02-25 ENCOUNTER — Encounter: Payer: Self-pay | Admitting: Family Medicine

## 2023-02-25 VITALS — BP 132/70 | HR 52 | Temp 98.1°F | Ht 72.0 in | Wt 187.2 lb

## 2023-02-25 DIAGNOSIS — Z1211 Encounter for screening for malignant neoplasm of colon: Secondary | ICD-10-CM | POA: Diagnosis not present

## 2023-02-25 DIAGNOSIS — I10 Essential (primary) hypertension: Secondary | ICD-10-CM

## 2023-02-25 DIAGNOSIS — C8290 Follicular lymphoma, unspecified, unspecified site: Secondary | ICD-10-CM

## 2023-02-25 DIAGNOSIS — Z0000 Encounter for general adult medical examination without abnormal findings: Secondary | ICD-10-CM | POA: Diagnosis not present

## 2023-02-25 NOTE — Patient Instructions (Addendum)
Pick up stool test on way out for colon cancer screening.

## 2023-02-25 NOTE — Assessment & Plan Note (Addendum)
Derm [facility] Dr. Viviano Simas and Dr. Adolphus Birchwood locally..  In  10/2022 had recurrence on scalp... treated  with radiation x 2 treatemetns.  Has follow up next week.

## 2023-02-25 NOTE — Assessment & Plan Note (Addendum)
Well-controlled on  1/2 tablet of losartan 25 mg daily.

## 2023-02-25 NOTE — Progress Notes (Signed)
Patient ID: Jonathon Jacobs, male    DOB: 01-07-1961, 62 y.o.   MRN: [id number]  This visit was conducted in person.  BP 132/70 (BP Location: Right Arm, Patient Position: Sitting, Cuff Size: Large)   Pulse (!) 52   Temp 98.1 F (36.7 C) (Temporal)   Ht 6' (1.829 m)   Wt 187 lb 4 oz (84.9 kg)   SpO2 99%   BMI 25.40 kg/m    CC:  Chief Complaint  Patient presents with   Annual Exam    Subjective:   HPI: Jonathon Jacobs is a 62 y.o. male presenting on 02/25/2023 for Annual Exam The patient presents for complete physical and review of chronic health problems. He/She also has the following acute concerns today:  Hypertension:  Well controlled   No longer on HCTZ 25 mg daily, using 1/2 tab of losartan  25 mg daily Has improved since stopping sodium bicarb. BP Readings from Last 3 Encounters:  02/25/23 132/70  11/25/21 138/86  05/23/21 (!) 144/86  Using medication without problems or lightheadedness:  none Chest pain with exertion: none Edema: none Short of breath: none Average home BPs: Other issues:  Reviewed labs in detail  Lab Results  Component Value Date   CHOL 169 02/16/2023   HDL 67.80 02/16/2023   LDLCALC 92 02/16/2023   TRIG 45.0 02/16/2023   CHOLHDL 2 02/16/2023    The 10-year ASCVD risk score (Arnett DK, et al., 2019) is: 8.1%   Values used to calculate the score:     Age: 13 years     Sex: Male     Is Non-Hispanic African American: No     Diabetic: No     Tobacco smoker: No     Systolic Blood Pressure: 132 mmHg     Is BP treated: Yes     HDL Cholesterol: 67.8 mg/dL     Total Cholesterol: 169 mg/dL   Exercise:  5-6 daya week  Diet: pescatarian Vit D nml Wt Readings from Last 3 Encounters:  02/25/23 187 lb 4 oz (84.9 kg)  11/25/21 191 lb 4 oz (86.8 kg)  05/23/21 193 lb (87.5 kg)  Body mass index is 25.4 kg/m.    Follicular lymphoma followed  [facility] Dr. Viviano Simas.   In  10/2022 had recurrence on scalp... treated  with radiation x 2  treatemetns.  Has follow up next week.  Increased stress with mother with dementia... caregiver for her, had to quit job.  Mother now in memory care unit.  Plans to return to work n fall.    02/25/2023    8:30 AM 11/25/2021    8:47 AM 05/23/2021    8:47 AM  Depression screen PHQ 2/9  Decreased Interest 0 0 0  Down, Depressed, Hopeless 0 0 0  PHQ - 2 Score 0 0 0        Relevant past medical, surgical, family and social history reviewed and updated as indicated. Interim medical history since our last visit reviewed. Allergies and medications reviewed and updated. Outpatient Medications Prior to Visit  Medication Sig Dispense Refill   Ascorbic Acid (VITAMIN C PO) Take by mouth.     b complex vitamins capsule Take 1 capsule by mouth daily.     losartan (COZAAR) 25 MG tablet TAKE 0.5 TABLETS (12.5 MG TOTAL) BY MOUTH DAILY. WILL BE DUE FOR PHYSICAL IN 11/2022 45 tablet 0   No facility-administered medications prior to visit.     Per HPI unless  specifically indicated in ROS section below Review of Systems  Constitutional:  Negative for fatigue and fever.  HENT:  Negative for ear pain.   Eyes:  Negative for pain.  Respiratory:  Negative for cough and shortness of breath.   Cardiovascular:  Negative for chest pain, palpitations and leg swelling.  Gastrointestinal:  Negative for abdominal pain.  Genitourinary:  Negative for dysuria.  Musculoskeletal:  Negative for arthralgias.  Neurological:  Negative for syncope, light-headedness and headaches.  Psychiatric/Behavioral:  Negative for dysphoric mood.    Objective:  BP 132/70 (BP Location: Right Arm, Patient Position: Sitting, Cuff Size: Large)   Pulse (!) 52   Temp 98.1 F (36.7 C) (Temporal)   Ht 6' (1.829 m)   Wt 187 lb 4 oz (84.9 kg)   SpO2 99%   BMI 25.40 kg/m   Wt Readings from Last 3 Encounters:  02/25/23 187 lb 4 oz (84.9 kg)  11/25/21 191 lb 4 oz (86.8 kg)  05/23/21 193 lb (87.5 kg)      Physical  Exam Constitutional:      Appearance: He is well-developed.  HENT:     Head: Normocephalic.     Right Ear: Hearing normal.     Left Ear: Hearing normal.     Nose: Nose normal.  Neck:     Thyroid: No thyroid mass or thyromegaly.     Vascular: No carotid bruit.     Trachea: Trachea normal.  Cardiovascular:     Rate and Rhythm: Normal rate and regular rhythm.     Pulses: Normal pulses.     Heart sounds: Heart sounds not distant. No murmur heard.    No friction rub. No gallop.     Comments: No peripheral edema Pulmonary:     Effort: Pulmonary effort is normal. No respiratory distress.     Breath sounds: Normal breath sounds.  Skin:    General: Skin is warm and dry.     Findings: No rash.  Psychiatric:        Speech: Speech normal.        Behavior: Behavior normal.        Thought Content: Thought content normal.       Results for orders placed or performed in visit on 02/16/23  PSA  Result Value Ref Range   PSA 1.97 0.10 - 4.00 ng/mL  Lipid panel  Result Value Ref Range   Cholesterol 169 0 - 200 mg/dL   Triglycerides 82.9 0.0 - 149.0 mg/dL   HDL 56.21 >30.86 mg/dL   VLDL 9.0 0.0 - 57.8 mg/dL   LDL Cholesterol 92 0 - 99 mg/dL   Total CHOL/HDL Ratio 2    NonHDL 100.90   Comprehensive metabolic panel  Result Value Ref Range   Sodium 140 135 - 145 mEq/L   Potassium 4.0 3.5 - 5.1 mEq/L   Chloride 105 96 - 112 mEq/L   CO2 27 19 - 32 mEq/L   Glucose, Bld 91 70 - 99 mg/dL   BUN 14 6 - 23 mg/dL   Creatinine, Ser 4.69 0.40 - 1.50 mg/dL   Total Bilirubin 1.8 (H) 0.2 - 1.2 mg/dL   Alkaline Phosphatase 73 39 - 117 U/L   AST 23 0 - 37 U/L   ALT 21 0 - 53 U/L   Total Protein 6.9 6.0 - 8.3 g/dL   Albumin 4.1 3.5 - 5.2 g/dL   GFR 62.95 >28.41 mL/min   Calcium 9.3 8.4 - 10.5 mg/dL    This visit  occurred during the SARS-CoV-2 public health emergency.  Safety protocols were in place, including screening questions prior to the visit, additional usage of staff PPE, and extensive  cleaning of exam room while observing appropriate contact time as indicated for disinfecting solutions.   COVID 19 screen:  No recent travel or known exposure to COVID19 The patient denies respiratory symptoms of COVID 19 at this time. The importance of social distancing was discussed today.   Assessment and Plan The patient's preventative maintenance and recommended screening tests for an annual wellness exam were reviewed in full today. Brought up to date unless services declined.  Counselled on the importance of diet, exercise, and its role in overall health and mortality. The patient's FH and SH was reviewed, including their home life, tobacco status, and drug and alcohol status.   Vaccines: Consider Shingrix. Had Td at work Prostate Cancer Screen:  Dad with prostate cancer Lab Results  Component Value Date   PSA 1.97 02/16/2023   PSA 1.63 11/18/2021  Colon Cancer Screen:   virtual colonoscopy with skin cancer ( CT scan for 4 years). Ifob 11/2021, plans repeat      Smoking Status: former smoker ETOH/ drug use: none.none  Hep C:  done  HIV screen:   refused  Problem List Items Addressed This Visit     Follicular lymphoma [facility])     Derm [facility] Dr. Viviano Simas and Dr. Adolphus Birchwood locally..  In  10/2022 had recurrence on scalp... treated  with radiation x 2 treatemetns.  Has follow up next week.      HTN (hypertension), benign    Well-controlled on  1/2 tablet of losartan 25 mg daily.      Other Visit Diagnoses     Routine general medical examination at a health care facility    -  Primary   Colon cancer screening       Relevant Orders   Fecal occult blood, imunochemical           Kerby Nora, MD

## 2023-03-01 DIAGNOSIS — L821 Other seborrheic keratosis: Secondary | ICD-10-CM | POA: Diagnosis not present

## 2023-03-01 DIAGNOSIS — D229 Melanocytic nevi, unspecified: Secondary | ICD-10-CM | POA: Diagnosis not present

## 2023-03-01 DIAGNOSIS — L814 Other melanin hyperpigmentation: Secondary | ICD-10-CM | POA: Diagnosis not present

## 2023-03-01 DIAGNOSIS — C8260 Cutaneous follicle center lymphoma, unspecified site: Secondary | ICD-10-CM | POA: Diagnosis not present

## 2023-03-02 ENCOUNTER — Other Ambulatory Visit (INDEPENDENT_AMBULATORY_CARE_PROVIDER_SITE_OTHER): Payer: BC Managed Care – PPO

## 2023-03-02 DIAGNOSIS — Z1211 Encounter for screening for malignant neoplasm of colon: Secondary | ICD-10-CM | POA: Diagnosis not present

## 2023-03-03 LAB — FECAL OCCULT BLOOD, IMMUNOCHEMICAL: Fecal Occult Bld: NEGATIVE

## 2023-05-22 ENCOUNTER — Other Ambulatory Visit: Payer: Self-pay | Admitting: Family Medicine

## 2023-06-02 DIAGNOSIS — C8260 Cutaneous follicle center lymphoma, unspecified site: Secondary | ICD-10-CM | POA: Diagnosis not present

## 2023-06-02 DIAGNOSIS — L570 Actinic keratosis: Secondary | ICD-10-CM | POA: Diagnosis not present

## 2023-06-29 DIAGNOSIS — M9906 Segmental and somatic dysfunction of lower extremity: Secondary | ICD-10-CM | POA: Diagnosis not present

## 2023-06-29 DIAGNOSIS — M7918 Myalgia, other site: Secondary | ICD-10-CM | POA: Diagnosis not present

## 2023-06-29 DIAGNOSIS — M25562 Pain in left knee: Secondary | ICD-10-CM | POA: Diagnosis not present

## 2023-06-29 DIAGNOSIS — M25662 Stiffness of left knee, not elsewhere classified: Secondary | ICD-10-CM | POA: Diagnosis not present

## 2023-07-06 DIAGNOSIS — M7918 Myalgia, other site: Secondary | ICD-10-CM | POA: Diagnosis not present

## 2023-07-06 DIAGNOSIS — M9906 Segmental and somatic dysfunction of lower extremity: Secondary | ICD-10-CM | POA: Diagnosis not present

## 2023-07-06 DIAGNOSIS — M25662 Stiffness of left knee, not elsewhere classified: Secondary | ICD-10-CM | POA: Diagnosis not present

## 2023-07-06 DIAGNOSIS — M25562 Pain in left knee: Secondary | ICD-10-CM | POA: Diagnosis not present

## 2023-07-21 DIAGNOSIS — H35712 Central serous chorioretinopathy, left eye: Secondary | ICD-10-CM | POA: Diagnosis not present

## 2023-08-26 DIAGNOSIS — M7918 Myalgia, other site: Secondary | ICD-10-CM | POA: Diagnosis not present

## 2023-08-26 DIAGNOSIS — M25562 Pain in left knee: Secondary | ICD-10-CM | POA: Diagnosis not present

## 2023-08-26 DIAGNOSIS — M25662 Stiffness of left knee, not elsewhere classified: Secondary | ICD-10-CM | POA: Diagnosis not present

## 2023-08-26 DIAGNOSIS — M9906 Segmental and somatic dysfunction of lower extremity: Secondary | ICD-10-CM | POA: Diagnosis not present

## 2023-09-30 DIAGNOSIS — H52223 Regular astigmatism, bilateral: Secondary | ICD-10-CM | POA: Diagnosis not present

## 2023-09-30 DIAGNOSIS — H524 Presbyopia: Secondary | ICD-10-CM | POA: Diagnosis not present

## 2023-09-30 DIAGNOSIS — H5203 Hypermetropia, bilateral: Secondary | ICD-10-CM | POA: Diagnosis not present

## 2023-09-30 DIAGNOSIS — H35712 Central serous chorioretinopathy, left eye: Secondary | ICD-10-CM | POA: Diagnosis not present

## 2023-12-14 ENCOUNTER — Encounter: Payer: Self-pay | Admitting: Family Medicine

## 2023-12-14 ENCOUNTER — Ambulatory Visit (INDEPENDENT_AMBULATORY_CARE_PROVIDER_SITE_OTHER): Admitting: Family Medicine

## 2023-12-14 VITALS — BP 148/70 | HR 53 | Temp 98.9°F | Ht 71.75 in | Wt 188.0 lb

## 2023-12-14 DIAGNOSIS — J301 Allergic rhinitis due to pollen: Secondary | ICD-10-CM | POA: Diagnosis not present

## 2023-12-14 DIAGNOSIS — I10 Essential (primary) hypertension: Secondary | ICD-10-CM | POA: Diagnosis not present

## 2023-12-14 DIAGNOSIS — R0683 Snoring: Secondary | ICD-10-CM | POA: Diagnosis not present

## 2023-12-14 DIAGNOSIS — G4733 Obstructive sleep apnea (adult) (pediatric): Secondary | ICD-10-CM | POA: Insufficient documentation

## 2023-12-14 NOTE — Assessment & Plan Note (Signed)
 Chronic, year-round. He has a very small oropharynx.  He has not had noted any improvement with weight loss.  He is now at BMI 25. He has not had any benefit with oral appliance provided by dentist. Will refer to pulmonologist sleep specialist for sleep testing and likely CPAP versus other treatment methods such as inspire.

## 2023-12-14 NOTE — Assessment & Plan Note (Signed)
 Chronic, well-controlled in the past.  Borderline today after drinking caffeine prior to appointment. Continue current medication.  This may improve somewhat with sleep apnea treatment.

## 2023-12-14 NOTE — Progress Notes (Signed)
 Patient ID: Jonathon Jacobs, male    DOB: [birth year], 62 y.o.   MRN: [id number]  This visit was conducted in person.  BP (!) 148/70 (BP Location: Left Arm, Patient Position: Sitting, Cuff Size: Large)   Pulse (!) 53   Temp 98.9 F (37.2 C) (Temporal)   Ht 5' 11.75" (1.822 m)   Wt 188 lb (85.3 kg)   SpO2 99%   BMI 25.68 kg/m    CC:  Chief Complaint  Patient presents with   Snoring    Subjective:   HPI: Jonathon Jacobs is a 63 y.o. male presenting on 12/14/2023 for Snoring   Scheduled for CPX.Jonathon AasAaron AasIt is too early for CPX.. had in 02/2023 Changed to problem visit.   Wife has noted snoring for > 1 year. Snoring less if on side... less tired during the day if sleeps better.  Fatigue in AM.  No apneic spells.  He has tried using a dental device.  Jonathon Jacobs had a sleep study.   Hypertension: Borderline control in office today.   No longer on HCTZ 25 mg daily, using 1/2 tab of losartan   25 mg daily Has improved since stopping sodium bicarb. BP Readings from Last 3 Encounters:  12/14/23 (!) 148/70  02/25/23 132/70  11/25/21 138/86  Using medication without problems or lightheadedness:  none Chest pain with exertion: none Edema: none Short of breath: none Average home BPs: Other issues:  He has been having nasal congestion and allergy symptoms.  Was having fatigue, now better now that allergies have start.  Using loratadine  prn.        Relevant past medical, surgical, family and social history reviewed and updated as indicated. Interim medical history since our last visit reviewed. Allergies and medications reviewed and updated. Outpatient Medications Prior to Visit  Medication Sig Dispense Refill   clobetasol (TEMOVATE) 0.05 % external solution Apply 1 Application topically daily as needed.     Ascorbic Acid (VITAMIN C PO) Take by mouth.     b complex vitamins capsule Take 1 capsule by mouth daily.     losartan  (COZAAR ) 25 MG tablet TAKE 0.5 TABLET (12.5MG  TOTAL) BY MOUTH DAILY  (Patient taking differently: Take 12.5 mg by mouth in the morning and at bedtime.) 45 tablet 3   No facility-administered medications prior to visit.     Per HPI unless specifically indicated in ROS section below Review of Systems  Constitutional:  Negative for fatigue and fever.  HENT:  Negative for ear pain.   Eyes:  Negative for pain.  Respiratory:  Negative for cough and shortness of breath.   Cardiovascular:  Negative for chest pain, palpitations and leg swelling.  Gastrointestinal:  Negative for abdominal pain.  Genitourinary:  Negative for dysuria.  Musculoskeletal:  Negative for arthralgias.  Neurological:  Negative for syncope, light-headedness and headaches.  Psychiatric/Behavioral:  Negative for dysphoric mood.    Objective:  BP (!) 148/70 (BP Location: Left Arm, Patient Position: Sitting, Cuff Size: Large)   Pulse (!) 53   Temp 98.9 F (37.2 C) (Temporal)   Ht 5' 11.75" (1.822 m)   Wt 188 lb (85.3 kg)   SpO2 99%   BMI 25.68 kg/m   Wt Readings from Last 3 Encounters:  12/14/23 188 lb (85.3 kg)  02/25/23 187 lb 4 oz (84.9 kg)  11/25/21 191 lb 4 oz (86.8 kg)      Physical Exam Constitutional:      Appearance: He is well-developed.  HENT:  Head: Normocephalic.     Comments: Small oropharynx.    Right Ear: Hearing normal.     Left Ear: Hearing normal.     Nose: Nose normal.  Neck:     Thyroid: No thyroid mass or thyromegaly.     Vascular: No carotid bruit.     Trachea: Trachea normal.  Cardiovascular:     Rate and Rhythm: Normal rate and regular rhythm.     Pulses: Normal pulses.     Heart sounds: Heart sounds not distant. No murmur heard.    No friction rub. No gallop.     Comments: No peripheral edema Pulmonary:     Effort: Pulmonary effort is normal. No respiratory distress.     Breath sounds: Normal breath sounds.  Skin:    General: Skin is warm and dry.     Findings: No rash.  Psychiatric:        Speech: Speech normal.        Behavior:  Behavior normal.        Thought Content: Thought content normal.       Results for orders placed or performed in visit on 03/02/23  Fecal occult blood, imunochemical   Collection Time: 03/02/23  9:38 AM   Specimen: Stool  Result Value Ref Range   Fecal Occult Bld Negative Negative    This visit occurred during the SARS-CoV-2 public health emergency.  Safety protocols were in place, including screening questions prior to the visit, additional usage of staff PPE, and extensive cleaning of exam room while observing appropriate contact time as indicated for disinfecting solutions.   COVID 19 screen:  No recent travel or known exposure to COVID19 The patient denies respiratory symptoms of COVID 19 at this time. The importance of social distancing was discussed today.   Assessment and Plan  Problem List Items Addressed This Visit     HTN (hypertension), benign   Chronic, well-controlled in the past.  Borderline today after drinking caffeine prior to appointment. Continue current medication.  This may improve somewhat with sleep apnea treatment.      Seasonal allergic rhinitis due to pollen   Chronic, seasonal Likely contributing to his fatigue.  Discussed trial of alternate antihistamine versus nasal steroid.  He can try Claritin at bedtime instead of during the day for possible side effects. Also discussed how B12 helps with allergies and energy at times.      Snoring - Primary   Chronic, year-round. He has a very small oropharynx.  He has not had noted any improvement with weight loss.  He is now at BMI 25. He has not had any benefit with oral appliance provided by dentist. Will refer to pulmonologist sleep specialist for sleep testing and likely CPAP versus other treatment methods such as inspire.       Relevant Orders   Ambulatory referral to Pulmonology         Jonathon Lolling, MD

## 2023-12-14 NOTE — Assessment & Plan Note (Signed)
 Chronic, seasonal Likely contributing to his fatigue.  Discussed trial of alternate antihistamine versus nasal steroid.  He can try Claritin at bedtime instead of during the day for possible side effects. Also discussed how B12 helps with allergies and energy at times.

## 2024-01-05 DIAGNOSIS — D2272 Melanocytic nevi of left lower limb, including hip: Secondary | ICD-10-CM | POA: Diagnosis not present

## 2024-01-05 DIAGNOSIS — D225 Melanocytic nevi of trunk: Secondary | ICD-10-CM | POA: Diagnosis not present

## 2024-01-05 DIAGNOSIS — D2262 Melanocytic nevi of left upper limb, including shoulder: Secondary | ICD-10-CM | POA: Diagnosis not present

## 2024-01-05 DIAGNOSIS — L570 Actinic keratosis: Secondary | ICD-10-CM | POA: Diagnosis not present

## 2024-01-05 DIAGNOSIS — D2261 Melanocytic nevi of right upper limb, including shoulder: Secondary | ICD-10-CM | POA: Diagnosis not present

## 2024-01-05 DIAGNOSIS — C8260 Cutaneous follicle center lymphoma, unspecified site: Secondary | ICD-10-CM | POA: Diagnosis not present

## 2024-02-07 ENCOUNTER — Telehealth: Payer: Self-pay | Admitting: *Deleted

## 2024-02-07 DIAGNOSIS — E559 Vitamin D deficiency, unspecified: Secondary | ICD-10-CM

## 2024-02-07 DIAGNOSIS — Z1159 Encounter for screening for other viral diseases: Secondary | ICD-10-CM

## 2024-02-07 DIAGNOSIS — I10 Essential (primary) hypertension: Secondary | ICD-10-CM

## 2024-02-07 DIAGNOSIS — Z8042 Family history of malignant neoplasm of prostate: Secondary | ICD-10-CM

## 2024-02-07 NOTE — Telephone Encounter (Signed)
-----   Message from Veva JINNY Ferrari sent at 02/02/2024  2:55 PM EDT ----- Regarding: Lab orders for [facility], 7.10.25 Patient is scheduled for CPX labs, please order future labs, Thanks , Veva

## 2024-02-24 ENCOUNTER — Other Ambulatory Visit: Payer: BC Managed Care – PPO

## 2024-02-28 ENCOUNTER — Other Ambulatory Visit (INDEPENDENT_AMBULATORY_CARE_PROVIDER_SITE_OTHER)

## 2024-02-28 DIAGNOSIS — Z8042 Family history of malignant neoplasm of prostate: Secondary | ICD-10-CM | POA: Diagnosis not present

## 2024-02-28 DIAGNOSIS — I10 Essential (primary) hypertension: Secondary | ICD-10-CM | POA: Diagnosis not present

## 2024-02-29 ENCOUNTER — Ambulatory Visit: Payer: Self-pay | Admitting: Family Medicine

## 2024-02-29 LAB — COMPREHENSIVE METABOLIC PANEL WITH GFR
ALT: 22 U/L (ref 0–53)
AST: 23 U/L (ref 0–37)
Albumin: 4.3 g/dL (ref 3.5–5.2)
Alkaline Phosphatase: 73 U/L (ref 39–117)
BUN: 13 mg/dL (ref 6–23)
CO2: 29 meq/L (ref 19–32)
Calcium: 9.1 mg/dL (ref 8.4–10.5)
Chloride: 105 meq/L (ref 96–112)
Creatinine, Ser: 1.12 mg/dL (ref 0.40–1.50)
GFR: 70.58 mL/min (ref >60.00)
Glucose, Bld: 92 mg/dL (ref 70–99)
Potassium: 4.1 meq/L (ref 3.5–5.1)
Sodium: 142 meq/L (ref 135–145)
Total Bilirubin: 2.2 mg/dL — ABNORMAL HIGH (ref 0.2–1.2)
Total Protein: 6.7 g/dL (ref 6.0–8.3)

## 2024-02-29 LAB — PSA: PSA: 1.87 ng/mL (ref 0.10–4.00)

## 2024-02-29 LAB — LIPID PANEL
Cholesterol: 160 mg/dL (ref 0–200)
HDL: 59.9 mg/dL (ref >39.00)
LDL Cholesterol: 89 mg/dL (ref 0–99)
NonHDL: 100.15
Total CHOL/HDL Ratio: 3
Triglycerides: 54 mg/dL (ref 0.0–149.0)
VLDL: 10.8 mg/dL (ref 0.0–40.0)

## 2024-03-02 ENCOUNTER — Encounter: Payer: BC Managed Care – PPO | Admitting: Family Medicine

## 2024-03-14 ENCOUNTER — Encounter: Admitting: Family

## 2024-03-23 ENCOUNTER — Ambulatory Visit (INDEPENDENT_AMBULATORY_CARE_PROVIDER_SITE_OTHER): Admitting: Family Medicine

## 2024-03-23 VITALS — BP 130/78 | HR 50 | Temp 98.5°F | Ht 71.0 in | Wt 194.4 lb

## 2024-03-23 DIAGNOSIS — I10 Essential (primary) hypertension: Secondary | ICD-10-CM

## 2024-03-23 DIAGNOSIS — E7800 Pure hypercholesterolemia, unspecified: Secondary | ICD-10-CM | POA: Insufficient documentation

## 2024-03-23 DIAGNOSIS — Z0000 Encounter for general adult medical examination without abnormal findings: Secondary | ICD-10-CM

## 2024-03-23 DIAGNOSIS — Z1211 Encounter for screening for malignant neoplasm of colon: Secondary | ICD-10-CM | POA: Diagnosis not present

## 2024-03-23 DIAGNOSIS — C8290 Follicular lymphoma, unspecified, unspecified site: Secondary | ICD-10-CM | POA: Diagnosis not present

## 2024-03-23 NOTE — Assessment & Plan Note (Signed)
 Derm [facility] Dr. Alfonse Sables and Dr. Dela locally..  In  10/2022 had recurrence on scalp... treated  with radiation x 2 treatemetns.

## 2024-03-23 NOTE — Progress Notes (Signed)
 Patient ID: Jonathon Jacobs, male    DOB: Jul 01, 1961, 63 y.o.   MRN: [id number]  This visit was conducted in person.  BP 130/78   Pulse (!) 50   Temp 98.5 F (36.9 C) (Oral)   Ht 5' 11 (1.803 m)   Wt 194 lb 6 oz (88.2 kg)   SpO2 98%   BMI 27.11 kg/m    CC:  Chief Complaint  Patient presents with   Annual Exam    Subjective:   HPI: Jonathon Jacobs is a 63 y.o. male presenting on 03/23/2024 for Annual Exam The patient presents for complete physical and review of chronic health problems. He/She also has the following acute concerns today:  Hypertension:  Well controlled   On 1/2 tab of losartan   25 mg daily BP Readings from Last 3 Encounters:  03/23/24 130/78  12/14/23 (!) 148/70  02/25/23 132/70  Using medication without problems or lightheadedness:  none Chest pain with exertion: none Edema: none Short of breath: none Average home BPs: Other issues:  Reviewed labs in detail  Mother passed with  CAD/ dementia in last year. Lab Results  Component Value Date   CHOL 160 02/28/2024   HDL 59.90 02/28/2024   LDLCALC 89 02/28/2024   TRIG 54.0 02/28/2024   CHOLHDL 3 02/28/2024  The 10-year ASCVD risk score (Arnett DK, et al., 2019) is: 9%   Values used to calculate the score:     Age: 8 years     Clincally relevant sex: Male     Is Non-Hispanic African American: No     Diabetic: No     Tobacco smoker: No     Systolic Blood Pressure: 130 mmHg     Is BP treated: Yes     HDL Cholesterol: 59.9 mg/dL     Total Cholesterol: 160 mg/dL   Exercise:  2-3 days week  Diet: pescatarian Vit D nml Wt Readings from Last 3 Encounters:  03/23/24 194 lb 6 oz (88.2 kg)  12/14/23 188 lb (85.3 kg)  02/25/23 187 lb 4 oz (84.9 kg)  Body mass index is 27.11 kg/m.    Follicular lymphoma followed  [facility] Dr. Alfonse Sables.   In  10/2022 had recurrence on scalp... treated  with radiation x 2 treatemetns.       03/23/2024    4:30 PM 12/14/2023    3:32 PM 02/25/2023    8:30 AM   Depression screen PHQ 2/9  Decreased Interest 0 0 0  Down, Depressed, Hopeless 0 0 0  PHQ - 2 Score 0 0 0  Altered sleeping 0    Tired, decreased energy 1    Change in appetite 0    Feeling bad or failure about yourself  0    Trouble concentrating 0    Moving slowly or fidgety/restless 0    Suicidal thoughts 0    PHQ-9 Score 1    Difficult doing work/chores Not difficult at all          Relevant past medical, surgical, family and social history reviewed and updated as indicated. Interim medical history since our last visit reviewed. Allergies and medications reviewed and updated. Outpatient Medications Prior to Visit  Medication Sig Dispense Refill   Ascorbic Acid (VITAMIN C PO) Take by mouth.     b complex vitamins capsule Take 1 capsule by mouth daily.     clobetasol (TEMOVATE) 0.05 % external solution Apply 1 Application topically daily as needed.  losartan  (COZAAR ) 25 MG tablet TAKE 0.5 TABLET (12.5MG  TOTAL) BY MOUTH DAILY (Patient taking differently: Take 12.5 mg by mouth in the morning and at bedtime.) 45 tablet 3   No facility-administered medications prior to visit.     Per HPI unless specifically indicated in ROS section below Review of Systems  Constitutional:  Negative for fatigue and fever.  HENT:  Negative for ear pain.   Eyes:  Negative for pain.  Respiratory:  Negative for cough and shortness of breath.   Cardiovascular:  Negative for chest pain, palpitations and leg swelling.  Gastrointestinal:  Negative for abdominal pain.  Genitourinary:  Negative for dysuria.  Musculoskeletal:  Negative for arthralgias.  Neurological:  Negative for syncope, light-headedness and headaches.  Psychiatric/Behavioral:  Negative for dysphoric mood.    Objective:  BP 130/78   Pulse (!) 50   Temp 98.5 F (36.9 C) (Oral)   Ht 5' 11 (1.803 m)   Wt 194 lb 6 oz (88.2 kg)   SpO2 98%   BMI 27.11 kg/m   Wt Readings from Last 3 Encounters:  03/23/24 194 lb 6 oz (88.2 kg)   12/14/23 188 lb (85.3 kg)  02/25/23 187 lb 4 oz (84.9 kg)      Physical Exam Constitutional:      Appearance: He is well-developed.  HENT:     Head: Normocephalic.     Right Ear: Hearing normal.     Left Ear: Hearing normal.     Nose: Nose normal.  Neck:     Thyroid: No thyroid mass or thyromegaly.     Vascular: No carotid bruit.     Trachea: Trachea normal.  Cardiovascular:     Rate and Rhythm: Normal rate and regular rhythm.     Pulses: Normal pulses.     Heart sounds: Heart sounds not distant. No murmur heard.    No friction rub. No gallop.     Comments: No peripheral edema Pulmonary:     Effort: Pulmonary effort is normal. No respiratory distress.     Breath sounds: Normal breath sounds.  Skin:    General: Skin is warm and dry.     Findings: No rash.  Psychiatric:        Speech: Speech normal.        Behavior: Behavior normal.        Thought Content: Thought content normal.       Results for orders placed or performed in visit on 02/28/24  PSA   Collection Time: 02/28/24  3:31 PM  Result Value Ref Range   PSA 1.87 0.10 - 4.00 ng/mL  Lipid panel   Collection Time: 02/28/24  3:31 PM  Result Value Ref Range   Cholesterol 160 0 - 200 mg/dL   Triglycerides 45.9 0.0 - 149.0 mg/dL   HDL 40.09 >60.99 mg/dL   VLDL 89.1 0.0 - 59.9 mg/dL   LDL Cholesterol 89 0 - 99 mg/dL   Total CHOL/HDL Ratio 3    NonHDL 100.15   Comprehensive metabolic panel   Collection Time: 02/28/24  3:31 PM  Result Value Ref Range   Sodium 142 135 - 145 mEq/L   Potassium 4.1 3.5 - 5.1 mEq/L   Chloride 105 96 - 112 mEq/L   CO2 29 19 - 32 mEq/L   Glucose, Bld 92 70 - 99 mg/dL   BUN 13 6 - 23 mg/dL   Creatinine, Ser 8.87 0.40 - 1.50 mg/dL   Total Bilirubin 2.2 (H) 0.2 - 1.2 mg/dL  Alkaline Phosphatase 73 39 - 117 U/L   AST 23 0 - 37 U/L   ALT 22 0 - 53 U/L   Total Protein 6.7 6.0 - 8.3 g/dL   Albumin 4.3 3.5 - 5.2 g/dL   GFR 29.41 >39.99 mL/min   Calcium 9.1 8.4 - 10.5 mg/dL     This visit occurred during the SARS-CoV-2 public health emergency.  Safety protocols were in place, including screening questions prior to the visit, additional usage of staff PPE, and extensive cleaning of exam room while observing appropriate contact time as indicated for disinfecting solutions.   COVID 19 screen:  No recent travel or known exposure to COVID19 The patient denies respiratory symptoms of COVID 19 at this time. The importance of social distancing was discussed today.   Assessment and Plan The patient's preventative maintenance and recommended screening tests for an annual wellness exam were reviewed in full today. Brought up to date unless services declined.  Counselled on the importance of diet, exercise, and its role in overall health and mortality. The patient's FH and SH was reviewed, including their home life, tobacco status, and drug and alcohol status.   Vaccines: Consider Shingrix. Had Td at work Prostate Cancer Screen:  Dad with prostate cancer Lab Results  Component Value Date   PSA 1.87 02/28/2024   PSA 1.97 02/16/2023   PSA 1.63 11/18/2021  Colon Cancer Screen:   virtual colonoscopy with skin cancer ( CT scan for 4 years). Ifob 11/2022,       Smoking Status: former smoker ETOH/ drug use: none.none  Hep C:  done  HIV screen:   refused  Problem List Items Addressed This Visit     Follicular lymphoma [facility])    Derm [facility] Dr. Alfonse Sables and Dr. Dela locally..  In  10/2022 had recurrence on scalp... treated  with radiation x 2 treatemetns.       RESOLVED: High cholesterol   HTN (hypertension), benign   Stable, chronic.  Continue current medication.  1/2 tablet of losartan  25 mg daily.      Other Visit Diagnoses       Routine general medical examination at a health care facility    -  Primary     Colon cancer screening       Relevant Orders   Fecal occult blood, imunochemical            Greig Ring, MD

## 2024-03-23 NOTE — Assessment & Plan Note (Signed)
 Stable, chronic.  Continue current medication.  1/2 tablet of losartan  25 mg daily.

## 2024-08-02 ENCOUNTER — Other Ambulatory Visit: Payer: Self-pay | Admitting: Family Medicine

## 2024-08-02 MED ORDER — LOSARTAN POTASSIUM 25 MG PO TABS: ORAL_TABLET | ORAL | 1 refills | Status: DC

## 2024-08-02 NOTE — Telephone Encounter (Signed)
 Copied from CRM #[id number]. Topic: Clinical - Medication Refill >> Aug 02, 2024 12:35 PM Mercedes MATSU wrote: Medication: losartan  (COZAAR ) 25 MG tablet  Has the patient contacted their pharmacy? Yes (Agent: If no, request that the patient contact the pharmacy for the refill. If patient does not wish to contact the pharmacy document the reason why and proceed with request.) (Agent: If yes, when and what did the pharmacy advise?)  This is the patient's preferred pharmacy:  [id number] - [place],  - [address] [place] [address] Phone: [phone] Fax: [phone]  Is this the correct pharmacy for this prescription? Yes If no, delete pharmacy and type the correct one.   Has the prescription been filled recently? Yes  Is the patient out of the medication? Yes  Has the patient been seen for an appointment in the last year OR does the patient have an upcoming appointment? Yes  Can we respond through MyChart? Yes  Agent: Please be advised that Rx refills may take up to 3 business days. We ask that you follow-up with your pharmacy.

## 2024-08-03 ENCOUNTER — Other Ambulatory Visit: Payer: Self-pay | Admitting: *Deleted

## 2024-08-03 ENCOUNTER — Encounter: Payer: Self-pay | Admitting: Family Medicine

## 2024-08-03 ENCOUNTER — Ambulatory Visit: Admitting: Family Medicine

## 2024-08-03 VITALS — Ht 71.0 in

## 2024-08-03 DIAGNOSIS — I10 Essential (primary) hypertension: Secondary | ICD-10-CM

## 2024-08-03 MED ORDER — LOSARTAN POTASSIUM 25 MG PO TABS: ORAL_TABLET | ORAL | 1 refills | Status: DC

## 2024-08-03 NOTE — Telephone Encounter (Signed)
 Copied from CRM #[id number]. Topic: Clinical - Medication Refill >> Aug 02, 2024 12:35 PM Mercedes MATSU wrote: Medication: losartan  (COZAAR ) 25 MG tablet  Has the patient contacted their pharmacy? Yes (Agent: If no, request that the patient contact the pharmacy for the refill. If patient does not wish to contact the pharmacy document the reason why and proceed with request.) (Agent: If yes, when and what did the pharmacy advise?)  This is the patient's preferred pharmacy:  [id number] - [place], [place] - [address] [place] [address] Phone: [phone] Fax: [phone]  Is this the correct pharmacy for this prescription? Yes If no, delete pharmacy and type the correct one.   Has the prescription been filled recently? Yes  Is the patient out of the medication? Yes  Has the patient been seen for an appointment in the last year OR does the patient have an upcoming appointment? Yes  Can we respond through MyChart? Yes  Agent: Please be advised that Rx refills may take up to 3 business days. We ask that you follow-up with your pharmacy. >> Aug 02, 2024  5:03 PM China J wrote: The patient is calling to let us  know that we sent his medication to the wrong pharmacy. The patient is needing this sent to [facility].  [id number] [place], [place] - [address] AT [facility] OF [address]  [address] [place] [address]  Phone: [phone] Fax: [phone]  Hours: Not open 24 hours

## 2024-08-03 NOTE — Telephone Encounter (Signed)
 Refill resent to [facility] in [place].

## 2024-08-04 NOTE — Progress Notes (Signed)
 Cancelled.

## 2024-08-17 ENCOUNTER — Encounter: Payer: Self-pay | Admitting: Emergency Medicine

## 2024-08-17 ENCOUNTER — Ambulatory Visit
Admission: EM | Admit: 2024-08-17 | Discharge: 2024-08-17 | Disposition: A | Discharge status: Home / Self Care | Attending: Family Medicine | Admitting: Family Medicine

## 2024-08-17 DIAGNOSIS — J019 Acute sinusitis, unspecified: Secondary | ICD-10-CM | POA: Diagnosis not present

## 2024-08-17 DIAGNOSIS — B9689 Other specified bacterial agents as the cause of diseases classified elsewhere: Secondary | ICD-10-CM

## 2024-08-17 MED ORDER — AMOXICILLIN-POT CLAVULANATE 875-125 MG PO TABS: 1.0000 | ORAL_TABLET | Freq: Two times a day (BID) | ORAL | 0 refills | Status: AC

## 2024-08-17 NOTE — ED Triage Notes (Signed)
 Pt c/o sinus pressure, drainage and some cough x 2 weeks. Pt has taken afrin, OTC cold medication and nasal rinses.

## 2024-08-17 NOTE — ED Provider Notes (Signed)
 " [facility]    CSN: [id number] Arrival date & time: 08/17/24  1357      History   Chief Complaint Chief Complaint  Patient presents with   Nasal Congestion   sinus pressure     HPI Jonathon Jacobs is a 64 y.o. male  presents for evaluation of URI symptoms for 2 weeks. Patient reports associated symptoms of sinus pressure/pain with purulent nasal discharge postnasal drip and cough. Denies N/V/D, fevers, sore throat, body aches or shortness of breath. Patient does not have a hx of asthma. Patient is not an active smoker.     Pt has taken sinus medication and sinus rinses OTC for symptoms. Pt has no other concerns at this time.   HPI  Past Medical History:  Diagnosis Date   Allergy    Follicular lymphoma (HCC) 03/2017   Rad tx's    Hypertension     Patient Active Problem List   Diagnosis Date Noted   Snoring 12/14/2023   Seasonal allergic rhinitis due to pollen 12/14/2023   HTN (hypertension), benign 05/23/2021   Family history of prostate cancer in father 05/23/2021   Follicular lymphoma (HCC) 04/28/2017    Past Surgical History:  Procedure Laterality Date   TONSILLECTOMY AND ADENOIDECTOMY         Home Medications    Prior to Admission medications  Medication Sig Start Date End Date Taking? Authorizing Provider  amoxicillin-clavulanate (AUGMENTIN) 875-125 MG tablet Take 1 tablet by mouth every 12 (twelve) hours. 08/17/24  Yes Jassmin Kemmerer, Jodi R, NP  Ascorbic Acid (VITAMIN C PO) Take by mouth.    [provider]  b complex vitamins capsule Take 1 capsule by mouth daily.    [provider]  clobetasol (TEMOVATE) 0.05 % external solution Apply 1 Application topically daily as needed. 09/13/23   [provider]  losartan  (COZAAR ) 25 MG tablet TAKE 0.5 TABLET (12.5MG  TOTAL) BY MOUTH DAILY 08/03/24   Avelina Greig BRAVO, MD    Family History Family History  Problem Relation Age of Onset   Cervical cancer Mother    Heart disease Mother     Prostate cancer Father    Alcohol abuse Brother    Hypertension Maternal Grandmother    Hypertension Maternal Grandfather    Dementia Maternal Grandfather    Hypertension Paternal Grandmother    Hypertension Paternal Grandfather     Social History Social History[1]   Allergies   Patient has no known allergies.   Review of Systems Review of Systems  HENT:  Positive for congestion, postnasal drip, sinus pressure and sinus pain.   Respiratory:  Positive for cough.      Physical Exam Triage Vital Signs ED Triage Vitals  Encounter Vitals Group     BP 08/17/24 1456 (!) 179/96     Girls Systolic BP Percentile --      Girls Diastolic BP Percentile --      Boys Systolic BP Percentile --      Boys Diastolic BP Percentile --      Pulse Rate 08/17/24 1456 (!) 54     Resp 08/17/24 1456 16     Temp 08/17/24 1456 98.5 F (36.9 C)     Temp Source 08/17/24 1456 Oral     SpO2 08/17/24 1456 98 %     Weight 08/17/24 1456 197 lb (89.4 kg)     Height --      Head Circumference --      Peak Flow --  Pain Score 08/17/24 1455 0     Pain Loc --      Pain Education --      Exclude from Growth Chart --    No data found.  Updated Vital Signs BP (!) 179/96 (BP Location: Right Arm) Comment: Pt has not taken BP meds today  Pulse (!) 54   Temp 98.5 F (36.9 C) (Oral)   Resp 16   Wt 197 lb (89.4 kg)   SpO2 98%   BMI 27.48 kg/m   Visual Acuity Right Eye Distance:   Left Eye Distance:   Bilateral Distance:    Right Eye Near:   Left Eye Near:    Bilateral Near:     Physical Exam Vitals and nursing note reviewed.  Constitutional:      General: He is not in acute distress.    Appearance: Normal appearance. He is not ill-appearing or toxic-appearing.  HENT:     Head: Normocephalic and atraumatic.     Right Ear: Tympanic membrane and ear canal normal.     Left Ear: Tympanic membrane and ear canal normal.     Nose: Congestion present.     Right Turbinates: Swollen and pale.      Left Turbinates: Swollen and pale.     Right Sinus: Maxillary sinus tenderness present. No frontal sinus tenderness.     Left Sinus: Maxillary sinus tenderness present. No frontal sinus tenderness.     Mouth/Throat:     Mouth: Mucous membranes are moist.     Pharynx: No oropharyngeal exudate or posterior oropharyngeal erythema.  Eyes:     Pupils: Pupils are equal, round, and reactive to light.  Cardiovascular:     Rate and Rhythm: Normal rate and regular rhythm.     Heart sounds: Normal heart sounds.  Pulmonary:     Effort: Pulmonary effort is normal.     Breath sounds: Normal breath sounds.  Musculoskeletal:     Cervical back: Normal range of motion and neck supple.  Lymphadenopathy:     Cervical: No cervical adenopathy.  Skin:    General: Skin is warm and dry.  Neurological:     General: No focal deficit present.     Mental Status: He is alert and oriented to person, place, and time.  Psychiatric:        Mood and Affect: Mood normal.        Behavior: Behavior normal.      UC Treatments / Results  Labs (all labs ordered are listed, but only abnormal results are displayed) Labs Reviewed - No data to display  EKG   Radiology No results found.  Procedures Procedures (including critical care time)  Medications Ordered in UC Medications - No data to display  Initial Impression / Assessment and Plan / UC Course  I have reviewed the triage vital signs and the nursing notes.  Pertinent labs & imaging results that were available during my care of the patient were reviewed by me and considered in my medical decision making (see chart for details).     Start Augmentin for sinusitis.  Flonase daily and continue nasal rinses as tolerated.  PCP follow-up if symptoms do not improve.  ER precautions reviewed. Final Clinical Impressions(s) / UC Diagnoses   Final diagnoses:  Acute bacterial sinusitis     Discharge Instructions      Start Augmentin twice daily for  7 days.  Use Flonase nasal spray daily as well.  Continue nasal rinses as needed.  Lots  of rest and fluids.  Follow-up with your PCP if your symptoms do not improve.  Please go to the ER for any worsening symptoms.  Hope you feel better soon!    ED Prescriptions     Medication Sig Dispense Auth. Provider   amoxicillin-clavulanate (AUGMENTIN) 875-125 MG tablet Take 1 tablet by mouth every 12 (twelve) hours. 14 tablet Kimmie Berggren, Jodi R, NP      PDMP not reviewed this encounter.    [1]  Social History Tobacco Use   Smoking status: Former   Smokeless tobacco: Never  Vaping Use   Vaping status: Never Used  Substance Use Topics   Alcohol use: No   Drug use: No     Loreda Myla SAUNDERS, NP 08/17/24 1512  "

## 2024-08-17 NOTE — Discharge Instructions (Addendum)
 Start Augmentin twice daily for 7 days.  Use Flonase nasal spray daily as well.  Continue nasal rinses as needed.  Lots of rest and fluids.  Follow-up with your PCP if your symptoms do not improve.  Please go to the ER for any worsening symptoms.  Hope you feel better soon!

## 2024-08-22 ENCOUNTER — Ambulatory Visit: Payer: Self-pay | Admitting: Family Medicine

## 2024-08-22 VITALS — BP 140/70 | HR 58 | Temp 97.8°F | Ht 71.0 in | Wt 197.1 lb

## 2024-08-22 DIAGNOSIS — G4733 Obstructive sleep apnea (adult) (pediatric): Secondary | ICD-10-CM

## 2024-08-22 DIAGNOSIS — I10 Essential (primary) hypertension: Secondary | ICD-10-CM

## 2024-08-22 MED ORDER — LOSARTAN POTASSIUM 50 MG PO TABS: 50.0000 mg | ORAL_TABLET | Freq: Every day | ORAL | 3 refills | Status: DC

## 2024-08-22 NOTE — Progress Notes (Signed)
 Cancelled   Patient ID: Jonathon Jacobs, male    DOB: [birth year], 64 y.o.   MRN: [id number]  This visit was conducted in person.  BP (!) 140/70   Pulse (!) 58   Temp 97.8 F (36.6 C) (Temporal)   Ht 5' 11 (1.803 m)   Wt 197 lb 2 oz (89.4 kg)   SpO2 99%   BMI 27.49 kg/m    CC:  Chief Complaint  Patient presents with   Hypertension    Wants to discuss increasing BP med dose    Subjective:   HPI: Jonathon Jacobs is a 64 y.o. male presenting on 08/22/2024 for Hypertension (Wants to discuss increasing BP med dose)   Recent treatment for bacterial sinusitis 1/1 with Augmentin  x 7days BP was high at urgent care but was on decongestants OTC.  Yellow mucus in AMs but all fever and face pain resolved. Using nasals saline irrigation.   He has noted increase in BP with increase stress, caffeine intake  Inadequate water intake.  Hypertension:   Inadequate control in last  few months.. has self increased to 25 m daily.  On losartan  25 mg daily  In past has been on losartan  25 mg and hydrochlorothiazide  25 mg daily  OSA treated with mouth guard by dentist. And nasal tube.. no further snoring, no apneic speell BP Readings from Last 3 Encounters:  08/22/24 (!) 140/70  08/17/24 (!) 179/96  03/23/24 130/78  Using medication without problems or lightheadedness: none  Chest pain with exertion: none Edema:none Short of breath: none Average home BPs: 145/85 Other issues:   Goes to gym... rowing challenge.. no symptoms  Wt Readings from Last 3 Encounters:  08/22/24 197 lb 2 oz (89.4 kg)  08/17/24 197 lb (89.4 kg)  03/23/24 194 lb 6 oz (88.2 kg)        Relevant past medical, surgical, family and social history reviewed and updated as indicated. Interim medical history since our last visit reviewed. Allergies and medications reviewed and updated. Outpatient Medications Prior to Visit  Medication Sig Dispense Refill   amoxicillin -clavulanate (AUGMENTIN ) 875-125 MG tablet Take 1 tablet by mouth  every 12 (twelve) hours. 14 tablet 0   Ascorbic Acid (VITAMIN C PO) Take by mouth.     b complex vitamins capsule Take 1 capsule by mouth daily.     clobetasol (TEMOVATE) 0.05 % external solution Apply 1 Application topically daily as needed.     losartan  (COZAAR ) 25 MG tablet TAKE 0.5 TABLET (12.5MG  TOTAL) BY MOUTH DAILY 45 tablet 1   No facility-administered medications prior to visit.     Per HPI unless specifically indicated in ROS section below Review of Systems  Constitutional:  Negative for fatigue and fever.  HENT:  Negative for ear pain.   Eyes:  Negative for pain.  Respiratory:  Negative for cough and shortness of breath.   Cardiovascular:  Negative for chest pain, palpitations and leg swelling.  Gastrointestinal:  Negative for abdominal pain.  Genitourinary:  Negative for dysuria.  Musculoskeletal:  Negative for arthralgias.  Neurological:  Negative for syncope, light-headedness and headaches.  Psychiatric/Behavioral:  Negative for dysphoric mood.    Objective:  BP (!) 140/70   Pulse (!) 58   Temp 97.8 F (36.6 C) (Temporal)   Ht 5' 11 (1.803 m)   Wt 197 lb 2 oz (89.4 kg)   SpO2 99%   BMI 27.49 kg/m   Wt Readings from Last 3 Encounters:  08/22/24 197 lb 2 oz (89.4  kg)  08/17/24 197 lb (89.4 kg)  03/23/24 194 lb 6 oz (88.2 kg)      Physical Exam Vitals reviewed.  Constitutional:      Appearance: He is well-developed.  HENT:     Head: Normocephalic.     Right Ear: Hearing normal.     Left Ear: Hearing normal.     Nose: Nose normal.  Neck:     Thyroid: No thyroid mass or thyromegaly.     Vascular: No carotid bruit.     Trachea: Trachea normal.  Cardiovascular:     Rate and Rhythm: Normal rate and regular rhythm.     Pulses: Normal pulses.     Heart sounds: Heart sounds not distant. No murmur heard.    No friction rub. No gallop.     Comments: No peripheral edema Pulmonary:     Effort: Pulmonary effort is normal. No respiratory distress.     Breath  sounds: Normal breath sounds.  Skin:    General: Skin is warm and dry.     Findings: No rash.  Psychiatric:        Speech: Speech normal.        Behavior: Behavior normal.        Thought Content: Thought content normal.       Results for orders placed or performed in visit on 02/28/24  PSA   Collection Time: 02/28/24  3:31 PM  Result Value Ref Range   PSA 1.87 0.10 - 4.00 ng/mL  Lipid panel   Collection Time: 02/28/24  3:31 PM  Result Value Ref Range   Cholesterol 160 0 - 200 mg/dL   Triglycerides 45.9 0.0 - 149.0 mg/dL   HDL 40.09 >60.99 mg/dL   VLDL 89.1 0.0 - 59.9 mg/dL   LDL Cholesterol 89 0 - 99 mg/dL   Total CHOL/HDL Ratio 3    NonHDL 100.15   Comprehensive metabolic panel   Collection Time: 02/28/24  3:31 PM  Result Value Ref Range   Sodium 142 135 - 145 mEq/L   Potassium 4.1 3.5 - 5.1 mEq/L   Chloride 105 96 - 112 mEq/L   CO2 29 19 - 32 mEq/L   Glucose, Bld 92 70 - 99 mg/dL   BUN 13 6 - 23 mg/dL   Creatinine, Ser 8.87 0.40 - 1.50 mg/dL   Total Bilirubin 2.2 (H) 0.2 - 1.2 mg/dL   Alkaline Phosphatase 73 39 - 117 U/L   AST 23 0 - 37 U/L   ALT 22 0 - 53 U/L   Total Protein 6.7 6.0 - 8.3 g/dL   Albumin 4.3 3.5 - 5.2 g/dL   GFR 29.41 >39.99 mL/min   Calcium 9.1 8.4 - 10.5 mg/dL    Assessment and Plan  HTN (hypertension), benign Assessment & Plan: Chronic, worsening control  OSA treated Likely due to increase in stress, caffeine increase  Recommended stress reduction, caffeine increase, increase hydration for occ palpations. Avoid decongestants Increase losartan  to 50 mg daily  Follow BP at home.  Recheck in [redacted] weeks along with BMET to assess renal function and K on increased dose of ARB.     Orders: -     Basic metabolic panel with GFR; Future  OSA (obstructive sleep apnea) Assessment & Plan:  ON mouth guard and nasal tube per dentist... no further snoring.   Other orders -     Losartan  Potassium; Take 1 tablet (50 mg total) by mouth daily.   Dispense: 90 tablet; Refill: 3  Return in about 2 weeks (around 09/05/2024) for  follow up HTN and BMET check.   Greig Ring, MD

## 2024-08-22 NOTE — Assessment & Plan Note (Signed)
"   ON mouth guard and nasal tube per dentist... no further snoring. "

## 2024-08-22 NOTE — Assessment & Plan Note (Addendum)
 Chronic, worsening control  OSA treated Likely due to increase in stress, caffeine increase  Recommended stress reduction, caffeine increase, increase hydration for occ palpations. Avoid decongestants Increase losartan  to 50 mg daily  Follow BP at home.  Recheck in [redacted] weeks along with BMET to assess renal function and K on increased dose of ARB.

## 2024-09-05 ENCOUNTER — Ambulatory Visit: Admitting: Family Medicine

## 2024-09-06 ENCOUNTER — Encounter: Payer: Self-pay | Admitting: Family Medicine

## 2024-09-06 ENCOUNTER — Other Ambulatory Visit: Payer: Self-pay | Admitting: Radiology

## 2024-09-06 ENCOUNTER — Ambulatory Visit: Admitting: Family Medicine

## 2024-09-06 VITALS — BP 170/98 | HR 56 | Temp 98.5°F | Ht 71.0 in | Wt 197.4 lb

## 2024-09-06 DIAGNOSIS — I10 Essential (primary) hypertension: Secondary | ICD-10-CM

## 2024-09-06 DIAGNOSIS — Z1211 Encounter for screening for malignant neoplasm of colon: Secondary | ICD-10-CM

## 2024-09-06 DIAGNOSIS — R002 Palpitations: Secondary | ICD-10-CM | POA: Insufficient documentation

## 2024-09-06 LAB — FECAL OCCULT BLOOD, IMMUNOCHEMICAL: Fecal Occult Bld: NEGATIVE

## 2024-09-06 MED ORDER — LOSARTAN POTASSIUM 50 MG PO TABS: 100.0000 mg | ORAL_TABLET | Freq: Every day | ORAL | Status: AC

## 2024-09-06 NOTE — Progress Notes (Signed)
 Cancelled   Patient ID: Jonathon Jacobs, male    DOB: September 18, 1960, 64 y.o.   MRN: [id number]  This visit was conducted in person.  BP (!) 170/98   Pulse (!) 56   Temp 98.5 F (36.9 C) (Oral)   Ht 5' 11 (1.803 m)   Wt 197 lb 6 oz (89.5 kg)   SpO2 95%   BMI 27.53 kg/m    CC:  Chief Complaint  Patient presents with   Medical Management of Chronic Issues    Pt  here for HTN f/u    Subjective:   HPI: Jonathon Jacobs is a 64 y.o. male presenting on 09/06/2024 for Medical Management of Chronic Issues (Pt  here for HTN f/u)    Hypertension: Continued poor control despite increase of losartan  to   He continues to feel anxious, under stress.... does have palpitations when he he is more stressed out.  Has not been been able to run this.  No SE to higher dose. BP Readings from Last 3 Encounters:  09/06/24 (!) 170/98  08/22/24 (!) 140/70  08/17/24 (!) 179/96  Using medication without problems or lightheadedness: none  Chest pain with exertion: none Edema:none Short of breath: none Average home BPs: 140/80 Other issues:   Goes to gym... rowing challenge.. no symptoms  Wt Readings from Last 3 Encounters:  09/06/24 197 lb 6 oz (89.5 kg)  08/22/24 197 lb 2 oz (89.4 kg)  08/17/24 197 lb (89.4 kg)        Relevant past medical, surgical, family and social history reviewed and updated as indicated. Interim medical history since our last visit reviewed. Allergies and medications reviewed and updated. Outpatient Medications Prior to Visit  Medication Sig Dispense Refill   Ascorbic Acid (VITAMIN C PO) Take by mouth.     b complex vitamins capsule Take 1 capsule by mouth daily.     clobetasol (TEMOVATE) 0.05 % external solution Apply 1 Application topically daily as needed.     losartan  (COZAAR ) 50 MG tablet Take 1 tablet (50 mg total) by mouth daily. 90 tablet 3   amoxicillin -clavulanate (AUGMENTIN ) 875-125 MG tablet Take 1 tablet by mouth every 12 (twelve) hours. (Patient not taking:  Reported on 09/06/2024) 14 tablet 0   No facility-administered medications prior to visit.     Per HPI unless specifically indicated in ROS section below Review of Systems  Constitutional:  Negative for fatigue and fever.  HENT:  Negative for ear pain.   Eyes:  Negative for pain.  Respiratory:  Negative for cough and shortness of breath.   Cardiovascular:  Negative for chest pain, palpitations and leg swelling.  Gastrointestinal:  Negative for abdominal pain.  Genitourinary:  Negative for dysuria.  Musculoskeletal:  Negative for arthralgias.  Neurological:  Negative for syncope, light-headedness and headaches.  Psychiatric/Behavioral:  Negative for dysphoric mood.    Objective:  BP (!) 170/98   Pulse (!) 56   Temp 98.5 F (36.9 C) (Oral)   Ht 5' 11 (1.803 m)   Wt 197 lb 6 oz (89.5 kg)   SpO2 95%   BMI 27.53 kg/m   Wt Readings from Last 3 Encounters:  09/06/24 197 lb 6 oz (89.5 kg)  08/22/24 197 lb 2 oz (89.4 kg)  08/17/24 197 lb (89.4 kg)      Physical Exam Vitals reviewed.  Constitutional:      Appearance: He is well-developed.  HENT:     Head: Normocephalic.     Right Ear: Hearing normal.  Left Ear: Hearing normal.     Nose: Nose normal.  Neck:     Thyroid: No thyroid mass or thyromegaly.     Vascular: No carotid bruit.     Trachea: Trachea normal.  Cardiovascular:     Rate and Rhythm: Normal rate and regular rhythm.     Pulses: Normal pulses.     Heart sounds: Heart sounds not distant. No murmur heard.    No friction rub. No gallop.     Comments: No peripheral edema Pulmonary:     Effort: Pulmonary effort is normal. No respiratory distress.     Breath sounds: Normal breath sounds.  Skin:    General: Skin is warm and dry.     Findings: No rash.  Psychiatric:        Speech: Speech normal.        Behavior: Behavior normal.        Thought Content: Thought content normal.       Results for orders placed or performed in visit on 02/28/24  PSA    Collection Time: 02/28/24  3:31 PM  Result Value Ref Range   PSA 1.87 0.10 - 4.00 ng/mL  Lipid panel   Collection Time: 02/28/24  3:31 PM  Result Value Ref Range   Cholesterol 160 0 - 200 mg/dL   Triglycerides 45.9 0.0 - 149.0 mg/dL   HDL 40.09 >60.99 mg/dL   VLDL 89.1 0.0 - 59.9 mg/dL   LDL Cholesterol 89 0 - 99 mg/dL   Total CHOL/HDL Ratio 3    NonHDL 100.15   Comprehensive metabolic panel   Collection Time: 02/28/24  3:31 PM  Result Value Ref Range   Sodium 142 135 - 145 mEq/L   Potassium 4.1 3.5 - 5.1 mEq/L   Chloride 105 96 - 112 mEq/L   CO2 29 19 - 32 mEq/L   Glucose, Bld 92 70 - 99 mg/dL   BUN 13 6 - 23 mg/dL   Creatinine, Ser 8.87 0.40 - 1.50 mg/dL   Total Bilirubin 2.2 (H) 0.2 - 1.2 mg/dL   Alkaline Phosphatase 73 39 - 117 U/L   AST 23 0 - 37 U/L   ALT 22 0 - 53 U/L   Total Protein 6.7 6.0 - 8.3 g/dL   Albumin 4.3 3.5 - 5.2 g/dL   GFR 29.41 >39.99 mL/min   Calcium 9.1 8.4 - 10.5 mg/dL    Assessment and Plan  HTN (hypertension), benign Assessment & Plan:  Chronic, inadequate control despite starting losartan .  Patient feels anxious in office today with recent increase stress.  Blood pressure has been somewhat improved at home but not yet at goal. Increase losartan  to 100 mg daily if basic metabolic panel in office is unchanged with normal kidney function and potassium. Continue to work on stress reduction, relaxation techniques and regular exercise.  Follow blood pressure at home and call with an update of blood pressure levels in 2 weeks or make a follow-up for reevaluation in office.  Orders: -     Basic metabolic panel with GFR  Palpitations Assessment & Plan: Acute intermittent.  Encourage patient to consider continue increasing water intake which he states is inadequate. He feels palpitations are very much related to levels of stress.   We will evaluate thyroid and CBC to make sure there is no secondary cause from anemia or thyroid issues. He is  asymptomatic with the palpitations.  Deep breathing and calming down or decrease in stress seems to help with palpitations resolved.  Instructed patient that if he becomes symptomatic with the palpitations or if they are increasing in frequency we will move forward with ZIO monitor.  Orders: -     CBC with Differential/Platelet -     TSH  Other orders -     Losartan  Potassium; Take 2 tablets (100 mg total) by mouth daily.     No follow-ups on file.   Greig Ring, MD

## 2024-09-06 NOTE — Assessment & Plan Note (Signed)
"   Chronic, inadequate control despite starting losartan .  Patient feels anxious in office today with recent increase stress.  Blood pressure has been somewhat improved at home but not yet at goal. Increase losartan  to 100 mg daily if basic metabolic panel in office is unchanged with normal kidney function and potassium. Continue to work on stress reduction, relaxation techniques and regular exercise.  Follow blood pressure at home and call with an update of blood pressure levels in 2 weeks or make a follow-up for reevaluation in office. "

## 2024-09-06 NOTE — Assessment & Plan Note (Signed)
 Acute intermittent.  Encourage patient to consider continue increasing water intake which he states is inadequate. He feels palpitations are very much related to levels of stress.   We will evaluate thyroid and CBC to make sure there is no secondary cause from anemia or thyroid issues. He is asymptomatic with the palpitations.  Deep breathing and calming down or decrease in stress seems to help with palpitations resolved.  Instructed patient that if he becomes symptomatic with the palpitations or if they are increasing in frequency we will move forward with ZIO monitor.

## 2024-09-07 ENCOUNTER — Ambulatory Visit: Payer: Self-pay | Admitting: Family Medicine

## 2024-09-07 LAB — CBC WITH DIFFERENTIAL/PLATELET
Basophils Absolute: 0.1 x10E3/uL (ref 0.0–0.2)
Basos: 1 %
EOS (ABSOLUTE): 0.1 x10E3/uL (ref 0.0–0.4)
Eos: 3 %
Hematocrit: 41.3 % (ref 37.5–51.0)
Hemoglobin: 13.3 g/dL (ref 13.0–17.7)
Immature Grans (Abs): 0 x10E3/uL (ref 0.0–0.1)
Immature Granulocytes: 0 %
Lymphocytes Absolute: 1.6 x10E3/uL (ref 0.7–3.1)
Lymphs: 35 %
MCH: 29.8 pg (ref 26.6–33.0)
MCHC: 32.2 g/dL (ref 31.5–35.7)
MCV: 93 fL (ref 79–97)
Monocytes Absolute: 0.4 x10E3/uL (ref 0.1–0.9)
Monocytes: 8 %
Neutrophils Absolute: 2.4 x10E3/uL (ref 1.4–7.0)
Neutrophils: 53 %
Platelets: 199 x10E3/uL (ref 150–450)
RBC: 4.46 x10E6/uL (ref 4.14–5.80)
RDW: 12.8 % (ref 11.6–15.4)
WBC: 4.5 x10E3/uL (ref 3.4–10.8)

## 2024-09-07 LAB — BASIC METABOLIC PANEL WITH GFR
BUN/Creatinine Ratio: 11 (ref 10–24)
BUN: 11 mg/dL (ref 8–27)
CO2: 22 mmol/L (ref 20–29)
Calcium: 9.8 mg/dL (ref 8.6–10.2)
Chloride: 105 mmol/L (ref 96–106)
Creatinine, Ser: 0.99 mg/dL (ref 0.76–1.27)
Glucose: 100 mg/dL — ABNORMAL HIGH (ref 70–99)
Potassium: 4.3 mmol/L (ref 3.5–5.2)
Sodium: 142 mmol/L (ref 134–144)
eGFR: 86 mL/min/1.73

## 2024-09-07 LAB — TSH: TSH: 0.833 u[IU]/mL (ref 0.450–4.500)

## 2025-03-20 ENCOUNTER — Other Ambulatory Visit

## 2025-03-27 ENCOUNTER — Encounter: Admitting: Family Medicine
